# Patient Record
Sex: Male | Born: 1985 | Race: White | Hispanic: No | Marital: Single | Smoking: Never smoker
Health system: Southern US, Community
[De-identification: ages and names within clinical notes are randomized; demographics above are authoritative.]

## PROBLEM LIST (undated history)

## (undated) DIAGNOSIS — C801 Malignant (primary) neoplasm, unspecified: Secondary | ICD-10-CM

## (undated) HISTORY — PX: RECTAL SURGERY: SHX760

---

## 2007-11-05 ENCOUNTER — Emergency Department (HOSPITAL_COMMUNITY): Admission: EM | Admit: 2007-11-05 | Discharge: 2007-11-06 | Payer: Self-pay | Admitting: Emergency Medicine

## 2010-02-26 ENCOUNTER — Encounter: Payer: Self-pay | Admitting: Family Medicine

## 2011-08-10 ENCOUNTER — Ambulatory Visit (INDEPENDENT_AMBULATORY_CARE_PROVIDER_SITE_OTHER): Payer: 59 | Admitting: Physician Assistant

## 2011-08-10 ENCOUNTER — Encounter: Payer: Self-pay | Admitting: Physician Assistant

## 2011-08-10 VITALS — BP 121/71 | HR 95 | Ht 72.0 in | Wt 320.0 lb

## 2011-08-10 DIAGNOSIS — L658 Other specified nonscarring hair loss: Secondary | ICD-10-CM

## 2011-08-10 DIAGNOSIS — L649 Androgenic alopecia, unspecified: Secondary | ICD-10-CM

## 2011-08-10 MED ORDER — FINASTERIDE 1 MG PO TABS: 5.0000 mg | ORAL_TABLET | Freq: Every day | ORAL | Status: DC

## 2011-08-10 NOTE — Progress Notes (Signed)
  Subjective:    Patient ID: Dwayne Fowler, male    DOB: 1985/02/25, 26 y.o.   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 PMH  Reviewed other than morbid obesity and androgenic baldness neg for any other health concerns. He has not recently had fasting labs checked. He was on Proscar for about 3 months when he prescription ran out. He has noticed his hair loss is starting to occur again at the back/top of his head. He had no side effects of medication. He wants to go back on today.  Pt has struggled with his weight his entire life. He has lost 100lbs or more 2 times in his life but gained it all back. He knows he can lose the weight but doesn't know if he can keep it off. He is ready to lose weight today. He is currently exercising 3 times a week on elliptical. He has not started making diet changes.    Review of Systems     Objective:   Physical Exam  Constitutional: He appears well-developed and well-nourished.       Morbidly obese.  HENT:  Head: Normocephalic and atraumatic.       Thinning of hair at the top/back of scalp.  Cardiovascular: Normal rate, regular rhythm, normal heart sounds and intact distal pulses.   Pulmonary/Chest: Effort normal and breath sounds normal. He has no wheezes.  Skin: Skin is warm and dry.  Psychiatric: He has a normal mood and affect. His behavior is normal.          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   Morbid obesity- BMI 49.9. We had a long talk about weight loss. He knows exactly what to do and usually takes him 1 year to lose 100lbs. We discussed what things are effecting him the ability to keep the weight off. One was just getting bored. We talked about changing his exericse routine every 2-3 months. Or joining a group work out club like cross fit or getting a Systems analyst every 2-3  months for a month or so. For diet we talked about increasing protein, lifting weights, limiting sugary drinks and desserts to once or twice a week. We talked about a nutrionist but pt wanted to wait until he had worked on his diet first and then he would call back when and if he wanted a referral. We can talk more about this at CPE.    Needs CPE with fasting labs. Pt aware and will schedule.

## 2011-08-10 NOTE — Patient Instructions (Addendum)
Increase protein. Set goals for weight loss. Consider nurtritonist. Will refill medication. Schedule CPE and will get labs.

## 2011-08-13 DIAGNOSIS — L649 Androgenic alopecia, unspecified: Secondary | ICD-10-CM | POA: Insufficient documentation

## 2011-11-24 ENCOUNTER — Emergency Department (INDEPENDENT_AMBULATORY_CARE_PROVIDER_SITE_OTHER): Payer: 59

## 2011-11-24 ENCOUNTER — Emergency Department (INDEPENDENT_AMBULATORY_CARE_PROVIDER_SITE_OTHER)
Admission: EM | Admit: 2011-11-24 | Discharge: 2011-11-24 | Disposition: A | Discharge status: Home / Self Care | Payer: 59 | Source: Home / Self Care

## 2011-11-24 DIAGNOSIS — J069 Acute upper respiratory infection, unspecified: Secondary | ICD-10-CM

## 2011-11-24 DIAGNOSIS — J40 Bronchitis, not specified as acute or chronic: Secondary | ICD-10-CM

## 2011-11-24 DIAGNOSIS — R05 Cough: Secondary | ICD-10-CM

## 2011-11-24 DIAGNOSIS — R062 Wheezing: Secondary | ICD-10-CM

## 2011-11-24 DIAGNOSIS — R0989 Other specified symptoms and signs involving the circulatory and respiratory systems: Secondary | ICD-10-CM

## 2011-11-24 MED ORDER — METHYLPREDNISOLONE SODIUM SUCC 125 MG IJ SOLR
125.0000 mg | Freq: Once | INTRAMUSCULAR | Status: AC
  Administered 2011-11-24: 125 mg via INTRAMUSCULAR

## 2011-11-24 MED ORDER — ALBUTEROL SULFATE HFA 108 (90 BASE) MCG/ACT IN AERS: 2.0000 | INHALATION_SPRAY | RESPIRATORY_TRACT | Status: DC | PRN

## 2011-11-24 MED ORDER — AZITHROMYCIN 250 MG PO TABS: ORAL_TABLET | ORAL | Status: DC

## 2011-11-24 NOTE — ED Provider Notes (Signed)
History     CSN: [id number]  Arrival date & time 11/24/11  1122   First MD Initiated Contact with Patient 11/24/11 1131      Chief Complaint  Patient presents with  . Cough  . Nasal Congestion   HPI URI Symptoms Onset: 8 days ago Description: chest congestion, cough, mild URI sxs  Modifying factors:  Recurrent second hand smoke exposure  Symptoms Nasal discharge: mild Fever: no Sore throat: no Cough: yes  Wheezing: no Ear pain: no GI symptoms: no Sick contacts: unsure  Red Flags  Stiff neck: no Dyspnea: on exertion Rash: no Swallowing difficulty: no  Sinusitis Risk Factors Headache/face pain: no Double sickening: no tooth pain: no  Allergy Risk Factors Sneezing: no Itchy scratchy throat: no Seasonal symptoms: no  Flu Risk Factors Headache: no muscle aches: no severe fatigue: no   History reviewed. No pertinent past medical history.  History reviewed. No pertinent past surgical history.  No family history on file.  History  Substance Use Topics  . Smoking status: Never Smoker   . Smokeless tobacco: Never Used  . Alcohol Use: Yes      Review of Systems  All other systems reviewed and are negative.    Allergies  Codeine  Home Medications   Current Outpatient Rx  Name Route Sig Dispense Refill  . FINASTERIDE 1 MG PO TABS Oral Take 5 tablets (5 mg total) by mouth daily. Take 1/4 tab everyday. 30 tablet 3    BP 119/65  Pulse 78  Temp 98.2 F (36.8 C) (Oral)  Resp 16  Ht 6' (1.829 m)  Wt 334 lb 12 oz (151.842 kg)  BMI 45.40 kg/m2  SpO2 96%  Physical Exam  Constitutional:       Obese, NAD, speaking in full sentences    HENT:  Head: Normocephalic and atraumatic.  Eyes: Conjunctivae normal are normal. Pupils are equal, round, and reactive to light.  Neck: Normal range of motion. Neck supple.  Cardiovascular: Normal rate and regular rhythm.   Pulmonary/Chest: Effort normal.       Faint wheezes in apices    Abdominal: Soft.    Musculoskeletal: Normal range of motion.  Neurological: He is alert.  Skin: Skin is warm.    ED Course  Procedures (including critical care time)  Labs Reviewed - No data to display Dg Chest 2 View  11/24/2011  *RADIOLOGY REPORT*  Clinical Data: Cough and congestion.  Rule out infiltrate  CHEST - 2 VIEW  Comparison: None.  Findings: The heart size and mediastinal contours are within normal limits.  Both lungs are clear.  The visualized skeletal structures are unremarkable.  IMPRESSION: Negative examination.   Original Report Authenticated By: Rosealee Albee, M.D.      1. URI (upper respiratory infection)   2. Bronchitis   3. Wheezing on auscultation       MDM  CXR negative for any focal infiltrate.  Will place on zpak for atypical coverage given duration of sxs.  Given underlying wheezing on exam and chronic secondhand smoke exposure, there is some concern for underlying intermittent obstructive lung disease.  Solumedrol 125mg  IM x1 Albuterol for prn use at home.  Plan to follow up with PCP to discuss sxs. Would consider formal PFTs 4-6 weeks after resolution of current sxs.      The patient and/or caregiver has been counseled thoroughly with regard to treatment plan and/or medications prescribed including dosage, schedule, interactions, rationale for use, and possible side effects and they  verbalize understanding. Diagnoses and expected course of recovery discussed and will return if not improved as expected or if the condition worsens. Patient and/or caregiver verbalized understanding.             Doree Albee, MD 11/24/11 1215

## 2011-11-24 NOTE — ED Notes (Signed)
Patient c/o cough, congestion for 8 days. Denies sore throat. Has take OTC meds with no relief. States fever first 2 days.

## 2013-03-13 ENCOUNTER — Other Ambulatory Visit: Payer: Self-pay | Admitting: Physician Assistant

## 2013-03-31 ENCOUNTER — Other Ambulatory Visit: Payer: Self-pay | Admitting: Physician Assistant

## 2013-03-31 ENCOUNTER — Ambulatory Visit (INDEPENDENT_AMBULATORY_CARE_PROVIDER_SITE_OTHER): Payer: 59 | Admitting: Physician Assistant

## 2013-03-31 ENCOUNTER — Encounter: Payer: Self-pay | Admitting: Physician Assistant

## 2013-03-31 VITALS — BP 129/66 | HR 105 | Temp 99.7°F | Wt 316.0 lb

## 2013-03-31 DIAGNOSIS — Z131 Encounter for screening for diabetes mellitus: Secondary | ICD-10-CM

## 2013-03-31 DIAGNOSIS — J069 Acute upper respiratory infection, unspecified: Secondary | ICD-10-CM

## 2013-03-31 DIAGNOSIS — R509 Fever, unspecified: Secondary | ICD-10-CM

## 2013-03-31 DIAGNOSIS — R52 Pain, unspecified: Secondary | ICD-10-CM

## 2013-03-31 DIAGNOSIS — J029 Acute pharyngitis, unspecified: Secondary | ICD-10-CM

## 2013-03-31 DIAGNOSIS — Z13220 Encounter for screening for lipoid disorders: Secondary | ICD-10-CM

## 2013-03-31 LAB — COMPLETE METABOLIC PANEL WITH GFR
ALT: 37 U/L (ref 0–53)
AST: 21 U/L (ref 0–37)
Albumin: 4.5 g/dL (ref 3.5–5.2)
Alkaline Phosphatase: 85 U/L (ref 39–117)
BUN: 11 mg/dL (ref 6–23)
CALCIUM: 9 mg/dL (ref 8.4–10.5)
CHLORIDE: 99 meq/L (ref 96–112)
CO2: 26 meq/L (ref 19–32)
Creat: 0.97 mg/dL (ref 0.50–1.35)
GFR, Est Non African American: >89 mL/min
Glucose, Bld: 101 mg/dL — ABNORMAL HIGH (ref 70–99)
Potassium: 4.1 mEq/L (ref 3.5–5.3)
SODIUM: 135 meq/L (ref 135–145)
TOTAL PROTEIN: 7.5 g/dL (ref 6.0–8.3)
Total Bilirubin: 0.4 mg/dL (ref 0.2–1.2)

## 2013-03-31 LAB — LIPID PANEL
Cholesterol: 202 mg/dL — ABNORMAL HIGH (ref 0–200)
HDL: 41 mg/dL (ref >39)
LDL CALC: 145 mg/dL — AB (ref 0–99)
Total CHOL/HDL Ratio: 4.9 Ratio
Triglycerides: 80 mg/dL (ref <150)
VLDL: 16 mg/dL (ref 0–40)

## 2013-03-31 LAB — CBC
HCT: 47.8 % (ref 39.0–52.0)
HEMOGLOBIN: 16.6 g/dL (ref 13.0–17.0)
MCH: 30.7 pg (ref 26.0–34.0)
MCHC: 34.7 g/dL (ref 30.0–36.0)
MCV: 88.5 fL (ref 78.0–100.0)
Platelets: 219 10*3/uL (ref 150–400)
RBC: 5.4 MIL/uL (ref 4.22–5.81)
RDW: 14.2 % (ref 11.5–15.5)
WBC: 5.5 10*3/uL (ref 4.0–10.5)

## 2013-03-31 LAB — TSH: TSH: 0.547 u[IU]/mL (ref 0.350–4.500)

## 2013-03-31 LAB — POCT INFLUENZA A/B
INFLUENZA B, POC: NEGATIVE
Influenza A, POC: NEGATIVE

## 2013-03-31 MED ORDER — PHENTERMINE HCL 37.5 MG PO TABS: 37.5000 mg | ORAL_TABLET | Freq: Every day | ORAL | Status: DC

## 2013-03-31 MED ORDER — AMOXICILLIN 500 MG PO CAPS: 500.0000 mg | ORAL_CAPSULE | Freq: Two times a day (BID) | ORAL | Status: DC

## 2013-03-31 NOTE — Patient Instructions (Addendum)
mucinex DM twice a day.  Will refer to nutritionist. Will start with phentermine.   Upper Respiratory Infection, Adult An upper respiratory infection (URI) is also known as the common cold. It is often caused by a type of germ (virus). Colds are easily spread (contagious). You can pass it to others by kissing, coughing, sneezing, or drinking out of the same glass. Usually, you get better in 1 or 2 weeks.  HOME CARE   Only take medicine as told by your doctor.  Use a warm mist humidifier or breathe in steam from a hot shower.  Drink enough water and fluids to keep your pee (urine) clear or pale yellow.  Get plenty of rest.  Return to work when your temperature is back to normal or as told by your doctor. You may use a face mask and wash your hands to stop your cold from spreading. GET HELP RIGHT AWAY IF:   After the first few days, you feel you are getting worse.  You have questions about your medicine.  You have chills, shortness of breath, or brown or red spit (mucus).  You have yellow or brown snot (nasal discharge) or pain in the face, especially when you bend forward.  You have a fever, puffy (swollen) neck, pain when you swallow, or white spots in the back of your throat.  You have a bad headache, ear pain, sinus pain, or chest pain.  You have a high-pitched whistling sound when you breathe in and out (wheezing).  You have a lasting cough or cough up blood.  You have sore muscles or a stiff neck. MAKE SURE YOU:   Understand these instructions.  Will watch your condition.  Will get help right away if you are not doing well or get worse. Document Released: 07/11/2007 Document Revised: 04/16/2011 Document Reviewed: 05/29/2010 [facility] Patient Information 2014 [place], [place].

## 2013-03-31 NOTE — Progress Notes (Signed)
Subjective:    Patient ID: Dwayne Fowler, male    DOB: Oct 06, 1985, 28 y.o.   MRN: [id number]  HPI Patient is a 28 year old male who presents to the clinic to discuss recent illness and weight.  Patient started feeling bad at 2:00 yesterday afternoon. He started having generalized body aches, sore throat, headache and a productive deep cough. His temperature has been running around 100 or 101. Today temperature is 99.7 with palpitations. She's been taking Tylenol and has helped some with fever. He feels a little nauseated but denies any vomiting or diarrhea. His wife was diagnosed with a sinus infection on Saturday. He denies any wheezing or shortness of breath.  Patient also is very concerned about his weight. He has lost over 100 pounds times in his life. 3 months ago he decided he wanted to lose weight again. He started doing the same thing she had previously done before and he's not getting the same result. He is down maybe 5 pounds from when he started 3 months ago. He has decreased his calories down to 1800 a day along with 4 days of cardio for at least an hour. Is very frustrated and is seeking help. He is also concerned because he has a family history of diabetes, high cholesterol. Months to make sure is not telling with any other metabolic diseases. His stay HEENT to 1800-calorie diet that he minutes to not always making great food choices.    Review of Systems     Objective:   Physical Exam  Constitutional: He is oriented to person, place, and time. He appears well-developed and well-nourished.  Obese  HENT:  Head: Normocephalic and atraumatic.  Right Ear: External ear normal.  Left Ear: External ear normal.  Nose: Nose normal.  Mouth/Throat: Oropharynx is clear and moist. No oropharyngeal exudate.  TMs clear bilaterally. Some mild erythema present in both the ears.  Oropharynx erythematous with post nasal drip present.    Eyes: Conjunctivae are normal.  Neck: Normal  range of motion. Neck supple.  Cardiovascular: Normal rate, regular rhythm and normal heart sounds.   Pulmonary/Chest: Effort normal and breath sounds normal. He has no wheezes.  Lymphadenopathy:    He has no cervical adenopathy.  Neurological: He is alert and oriented to person, place, and time.  Psychiatric: He has a normal mood and affect. His behavior is normal.          Assessment & Plan:  URI/flu like symptoms- INfluenza negative. Discussed likely viral and OTC treatment discussed. Encouraged patient to try Mucinex and potentially over-the-counter nasal spray. If not improving over the next couple days for symptomatic treatment patient can try Amoxil for 10 days.  Obesity, morbid- Will refer to a nutritionist. Discussed weight loss therapy options. We decided to start with phentermine. Patient was aware of side effects of palpitations, increased blood pressure, increasing heart rate, insomnia. He has any of these is to stop medication call office. Patient was advised not to start phentermine until after going better. Patient made aware of the phentermine is only as good as diet and exercise. Continue working out and counting calories. Consider adding more protein and decreasing Carb and sugar load. Patient was made aware of the other long-term weight loss options such as Belviq, contrave, and qysmia. Concern for family history and obesity a cholesterol and other lites might be abnormal. Will check lipids, screening for diabetes and check a thyroid level. Followup in one month to see me for weight loss, blood pressure checked  accountability.  Spent 30 minutes with patient greater than 50% of visit spent counseling patient regarding weight loss.Marland Kitchen

## 2013-04-01 LAB — TESTOSTERONE, FREE, TOTAL, SHBG
SEX HORMONE BINDING: 31 nmol/L (ref 13–71)
TESTOSTERONE-% FREE: 1.9 % (ref 1.6–2.9)
TESTOSTERONE: 106 ng/dL — AB (ref 300–890)
Testosterone, Free: 20.2 pg/mL — ABNORMAL LOW (ref 47.0–244.0)

## 2013-04-01 LAB — HEMOGLOBIN A1C
HEMOGLOBIN A1C: 5.7 % — AB (ref <5.7)
Mean Plasma Glucose: 117 mg/dL — ABNORMAL HIGH (ref <117)

## 2013-04-03 ENCOUNTER — Telehealth: Payer: Self-pay | Admitting: *Deleted

## 2013-04-28 ENCOUNTER — Ambulatory Visit (INDEPENDENT_AMBULATORY_CARE_PROVIDER_SITE_OTHER): Payer: 59 | Admitting: Physician Assistant

## 2013-04-28 ENCOUNTER — Encounter: Payer: Self-pay | Admitting: Physician Assistant

## 2013-04-28 VITALS — BP 116/73 | HR 86 | Ht 72.0 in | Wt 295.0 lb

## 2013-04-28 DIAGNOSIS — E291 Testicular hypofunction: Secondary | ICD-10-CM

## 2013-04-28 DIAGNOSIS — R7989 Other specified abnormal findings of blood chemistry: Secondary | ICD-10-CM

## 2013-04-28 DIAGNOSIS — Z6841 Body Mass Index (BMI) 40.0 and over, adult: Secondary | ICD-10-CM

## 2013-04-28 DIAGNOSIS — R635 Abnormal weight gain: Secondary | ICD-10-CM

## 2013-04-28 MED ORDER — PHENTERMINE HCL 37.5 MG PO TABS: 37.5000 mg | ORAL_TABLET | Freq: Every day | ORAL | Status: DC

## 2013-04-28 NOTE — Patient Instructions (Signed)
Zyrtec at night.

## 2013-04-28 NOTE — Progress Notes (Signed)
   Subjective:    Patient ID: Dwayne Fowler, male    DOB: 05/18/1985, 28 y.o.   MRN: [id number]  HPI Patient is a 28 year old obese male who presents to the clinic to followup on phentermine and medication management. He has tolerated phentermine parallel. The first couple of days he did have a headache but that quickly resolved. He has spell much less hungry and has decreased his calorie significantly. He has not been counting his calories but has made many adjustments with decreasing sugar, carbs, quantity. He is exercising more. This month he has increased exercise to 3-4 days. He does feel like his heart rate does increase faster than it previously did being on the phentermine but he reports to keep it in the controlled range while exercising. He denies any other side effects such as insomnia. He feels focused on the medication would like to continue. Patient lost 21 pounds this month.  He is much less fatigued however still feels tired. His testosterone was checked at last visit and was 106. We will recheck today.   Review of Systems     Objective:   Physical Exam  Constitutional: He is oriented to person, place, and time. He appears well-developed and well-nourished.  HENT:  Head: Normocephalic and atraumatic.  Cardiovascular: Normal rate, regular rhythm and normal heart sounds.   Pulmonary/Chest: Effort normal and breath sounds normal. He has no wheezes.  Neurological: He is alert and oriented to person, place, and time.  Psychiatric: He has a normal mood and affect. His behavior is normal.          Assessment & Plan:  Obesity/40 BMI/abnormal weight gain-refilled phentermine today. Encouraged patient to continue with diet and exercise changes. Congratulated patient on such good results. Will followup in one month with blood pressure, heart rate, weight results. Blood pressure and pulse look great today.  Low testosterone level-we'll recheck today to make sure low-level  confirm. Discuss with patient testosterone replacement options with chills. Patient made aware that if he would like to family plan he does not need to use testosterone replacement at this time. Since his levels were so low if he is not planning to have children in the near future I would suggest going on some type of replacement for symptom benefit. If patient is to go ahead will call in testosterone therapy to start in followup for month later. Patient was made aware of the risk of testosterone and side effects. Will also check a PSA level before starting therapy.  We discussed labs and full that were drawn at last visit.  Spent 30 minutes with patient greater than 50% of visit spent counseling patient regarding weight management as well as testosterone therapy.

## 2013-04-29 LAB — TESTOSTERONE, FREE, TOTAL, SHBG
Sex Hormone Binding: 31 nmol/L (ref 13–71)
TESTOSTERONE FREE: 92.5 pg/mL (ref 47.0–244.0)
Testosterone-% Free: 2.1 % (ref 1.6–2.9)
Testosterone: 434 ng/dL (ref 300–890)

## 2013-04-29 LAB — PSA: PSA: 0.28 ng/mL (ref <=4.00)

## 2013-05-06 ENCOUNTER — Ambulatory Visit: Payer: 59 | Admitting: Physician Assistant

## 2013-05-26 ENCOUNTER — Ambulatory Visit (INDEPENDENT_AMBULATORY_CARE_PROVIDER_SITE_OTHER): Payer: 59 | Admitting: Physician Assistant

## 2013-05-26 ENCOUNTER — Encounter: Payer: Self-pay | Admitting: *Deleted

## 2013-05-26 VITALS — BP 103/76 | HR 101 | Wt 289.0 lb

## 2013-05-26 DIAGNOSIS — R635 Abnormal weight gain: Secondary | ICD-10-CM

## 2013-05-26 MED ORDER — PHENTERMINE HCL 37.5 MG PO TABS: 37.5000 mg | ORAL_TABLET | Freq: Every day | ORAL | Status: DC

## 2013-05-26 NOTE — Progress Notes (Signed)
   Subjective:    Patient ID: Dwayne Fowler, male    DOB: 1985/07/29, 28 y.o.   MRN: [id number] Pt here for bp/weight check.  He has lost 6lbs since last visit. No complaints of any side effects. Beatris Ship, CMA HPI    Review of Systems     Objective:   Physical Exam        Assessment & Plan:  Pt is doing well and continues to lose weight. BP looks great. HR borderline. Will continue to monitor. Follow up in one month. Continue diet and exercise. Refilled phentermine. Jade breeback PA-C

## 2013-08-03 ENCOUNTER — Other Ambulatory Visit: Payer: Self-pay | Admitting: Physician Assistant

## 2013-10-19 ENCOUNTER — Encounter: Payer: Self-pay | Admitting: Physician Assistant

## 2013-10-19 ENCOUNTER — Ambulatory Visit (INDEPENDENT_AMBULATORY_CARE_PROVIDER_SITE_OTHER): Payer: 59 | Admitting: Physician Assistant

## 2013-10-19 VITALS — BP 126/69 | HR 73 | Ht 72.0 in | Wt 280.0 lb

## 2013-10-19 DIAGNOSIS — E669 Obesity, unspecified: Secondary | ICD-10-CM

## 2013-10-19 DIAGNOSIS — R635 Abnormal weight gain: Secondary | ICD-10-CM

## 2013-10-19 MED ORDER — PHENTERMINE HCL 37.5 MG PO TABS: 37.5000 mg | ORAL_TABLET | Freq: Every day | ORAL | Status: DC

## 2013-10-20 DIAGNOSIS — R635 Abnormal weight gain: Secondary | ICD-10-CM | POA: Insufficient documentation

## 2013-10-20 NOTE — Progress Notes (Signed)
   Subjective:    Patient ID: Dwayne Fowler, male    DOB: Nov 22, 1985, 28 y.o.   MRN: [id number]  HPI Pt presents to the clinic to start back on phentermine. He was on it for 2 months and lost weight each month. He did not come back into the office for nurse visit and was denied. He has worked out and started counting calories 1500-1800. Lost 9 more pounds since last visit. Denies any side effects except some sweating.    Review of Systems  All other systems reviewed and are negative.      Objective:   Physical Exam  Constitutional: He is oriented to person, place, and time. He appears well-developed and well-nourished.  Obese.   HENT:  Head: Normocephalic and atraumatic.  Cardiovascular: Normal rate, regular rhythm and normal heart sounds.   Neurological: He is alert and oriented to person, place, and time.  Psychiatric: He has a normal mood and affect. His behavior is normal.          Assessment & Plan:  Obesity/abnormal weight gain- BMI 37.97. Refilled phentermine. He is doing great. Continue exercising and keeping healthy calorie count. Follow up nurse visit in 2 months.

## 2013-12-25 ENCOUNTER — Ambulatory Visit (INDEPENDENT_AMBULATORY_CARE_PROVIDER_SITE_OTHER): Payer: 59 | Admitting: Physician Assistant

## 2013-12-25 VITALS — BP 106/70 | HR 81 | Wt 257.0 lb

## 2013-12-25 DIAGNOSIS — E669 Obesity, unspecified: Secondary | ICD-10-CM

## 2013-12-25 MED ORDER — PHENTERMINE HCL 37.5 MG PO TABS: 37.5000 mg | ORAL_TABLET | Freq: Every day | ORAL | Status: DC

## 2013-12-25 NOTE — Progress Notes (Signed)
   Subjective:    Patient ID: Dwayne Fowler, male    DOB: 01-28-1986, 28 y.o.   MRN: [id number]  HPI  Januel is here for a blood pressure and weight check. Denies insomnia, shortness of breath, chest pain or palpitations.    Review of Systems     Objective:   Physical Exam        Assessment & Plan:  Zayven has lost weight of 23lbs.  A refill will be sent to [facility]. Patient advised to schedule a follow up nurse visit for blood pressure and weight check. Jade breeback PA-C

## 2014-01-22 ENCOUNTER — Ambulatory Visit (INDEPENDENT_AMBULATORY_CARE_PROVIDER_SITE_OTHER): Payer: 59 | Admitting: Physician Assistant

## 2014-01-22 VITALS — BP 107/70 | HR 88 | Wt 249.0 lb

## 2014-01-22 DIAGNOSIS — R635 Abnormal weight gain: Secondary | ICD-10-CM

## 2014-01-22 MED ORDER — PHENTERMINE HCL 37.5 MG PO TABS: 37.5000 mg | ORAL_TABLET | Freq: Every day | ORAL | Status: DC

## 2014-01-22 NOTE — Progress Notes (Signed)
   Subjective:    Patient ID: Dwayne Fowler, male    DOB: 12/18/1985, 28 y.o.   MRN: [id number]  HPI  Dwayne Fowler is here for blood pressure and weight check. Denies insomnia or palpitations.    Review of Systems     Objective:   Physical Exam        Assessment & Plan:  Obesity - Patient has lost weight and blood pressure is normal. A refill on phentermine will be faxed to [facility].

## 2014-02-19 ENCOUNTER — Ambulatory Visit: Payer: 59 | Admitting: Physician Assistant

## 2014-03-26 ENCOUNTER — Ambulatory Visit (INDEPENDENT_AMBULATORY_CARE_PROVIDER_SITE_OTHER): Payer: 59 | Admitting: Physician Assistant

## 2014-03-26 ENCOUNTER — Encounter: Payer: Self-pay | Admitting: Physician Assistant

## 2014-03-26 VITALS — BP 105/66 | HR 73 | Ht 72.0 in | Wt 246.0 lb

## 2014-03-26 DIAGNOSIS — E785 Hyperlipidemia, unspecified: Secondary | ICD-10-CM

## 2014-03-26 DIAGNOSIS — E669 Obesity, unspecified: Secondary | ICD-10-CM

## 2014-03-26 DIAGNOSIS — R634 Abnormal weight loss: Secondary | ICD-10-CM

## 2014-03-26 DIAGNOSIS — Z131 Encounter for screening for diabetes mellitus: Secondary | ICD-10-CM

## 2014-03-26 LAB — COMPLETE METABOLIC PANEL WITH GFR
ALBUMIN: 4.5 g/dL (ref 3.5–5.2)
ALT: 39 U/L (ref 0–53)
AST: 31 U/L (ref 0–37)
Alkaline Phosphatase: 81 U/L (ref 39–117)
BUN: 24 mg/dL — AB (ref 6–23)
CALCIUM: 9.5 mg/dL (ref 8.4–10.5)
CO2: 27 mEq/L (ref 19–32)
CREATININE: 0.95 mg/dL (ref 0.50–1.35)
Chloride: 102 mEq/L (ref 96–112)
GLUCOSE: 77 mg/dL (ref 70–99)
Potassium: 4.9 mEq/L (ref 3.5–5.3)
Sodium: 136 mEq/L (ref 135–145)
Total Bilirubin: 0.6 mg/dL (ref 0.2–1.2)
Total Protein: 7.3 g/dL (ref 6.0–8.3)

## 2014-03-26 LAB — LIPID PANEL
CHOL/HDL RATIO: 5.3 ratio
Cholesterol: 210 mg/dL — ABNORMAL HIGH (ref 0–200)
HDL: 40 mg/dL (ref >39)
LDL Cholesterol: 155 mg/dL — ABNORMAL HIGH (ref 0–99)
Triglycerides: 77 mg/dL (ref <150)
VLDL: 15 mg/dL (ref 0–40)

## 2014-03-26 MED ORDER — PHENTERMINE HCL 37.5 MG PO TABS
37.5000 mg | ORAL_TABLET | Freq: Every day | ORAL | Status: DC
Start: 2014-03-26 — End: 2016-05-09

## 2014-03-26 NOTE — Progress Notes (Signed)
   Subjective:    Patient ID: Dwayne Fowler, male    DOB: 1985-08-22, 29 y.o.   MRN: [id number]  HPI Pt presents to the clinic to follow up on weight loss. Admits to not working out over last 2 months. Down another 3lbs. 1800 calorie diet. Doing ok with this. Goal 200lbs. No side effects. Doing great.    Review of Systems  All other systems reviewed and are negative.      Objective:   Physical Exam  Constitutional: He is oriented to person, place, and time. He appears well-developed and well-nourished.  HENT:  Head: Normocephalic and atraumatic.  Cardiovascular: Normal rate, regular rhythm and normal heart sounds.   Pulmonary/Chest: Effort normal and breath sounds normal. He has no wheezes.  Neurological: He is alert and oriented to person, place, and time.  Skin: Skin is dry.  Psychiatric: He has a normal mood and affect. His behavior is normal.          Assessment & Plan:  Abnormal weight gain/obesity- Goal is 200lbs. 46 pounds to go. Increase exercise. Given pt one month of 30 tabs to break in half and take daily to every other day. Vitals look good. Been on for 5 months will start to taper off over next couple of months. Follow up in 2-3 months.   Hyperlipidemia- been over a year. Will recheck. Last LDL 145.

## 2014-06-11 ENCOUNTER — Other Ambulatory Visit: Payer: Self-pay | Admitting: Physician Assistant

## 2014-10-25 ENCOUNTER — Other Ambulatory Visit: Payer: Self-pay | Admitting: Physician Assistant

## 2015-02-07 ENCOUNTER — Other Ambulatory Visit: Payer: Self-pay | Admitting: Physician Assistant

## 2015-09-15 ENCOUNTER — Other Ambulatory Visit: Payer: Self-pay | Admitting: Physician Assistant

## 2016-05-09 ENCOUNTER — Encounter: Payer: Self-pay | Admitting: Physician Assistant

## 2016-05-09 ENCOUNTER — Ambulatory Visit (INDEPENDENT_AMBULATORY_CARE_PROVIDER_SITE_OTHER): Payer: 59 | Admitting: Physician Assistant

## 2016-05-09 VITALS — BP 108/69 | HR 63 | Ht 72.0 in | Wt 259.0 lb

## 2016-05-09 DIAGNOSIS — E669 Obesity, unspecified: Secondary | ICD-10-CM | POA: Insufficient documentation

## 2016-05-09 DIAGNOSIS — R41840 Attention and concentration deficit: Secondary | ICD-10-CM

## 2016-05-09 DIAGNOSIS — K921 Melena: Secondary | ICD-10-CM | POA: Diagnosis not present

## 2016-05-09 DIAGNOSIS — Z0000 Encounter for general adult medical examination without abnormal findings: Secondary | ICD-10-CM

## 2016-05-09 DIAGNOSIS — Z131 Encounter for screening for diabetes mellitus: Secondary | ICD-10-CM

## 2016-05-09 DIAGNOSIS — F418 Other specified anxiety disorders: Secondary | ICD-10-CM | POA: Diagnosis not present

## 2016-05-09 DIAGNOSIS — Z13220 Encounter for screening for lipoid disorders: Secondary | ICD-10-CM

## 2016-05-09 DIAGNOSIS — F419 Anxiety disorder, unspecified: Secondary | ICD-10-CM

## 2016-05-09 DIAGNOSIS — F329 Major depressive disorder, single episode, unspecified: Secondary | ICD-10-CM

## 2016-05-09 MED ORDER — BUPROPION HCL ER (XL) 150 MG PO TB24: 150.0000 mg | ORAL_TABLET | Freq: Every day | ORAL | 1 refills | Status: DC

## 2016-05-09 NOTE — Progress Notes (Signed)
Subjective:    Patient ID: Dwayne Fowler, male    DOB: July 30, 1985, 31 y.o.   MRN: [id number]  HPI  Pt is a 31 yo male who presents to the clinic for CPE.   Pt continues to want to lose more weight. 2 years ago he was 334 today is 259 but would like to get to 220. He is working out regularly and lifting weights for muscle mass.   Pt is concerned about his focus and attention. He works from home. He has trouble getting things done. 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 Dropped out of high school. Never been ADD tested. He does admit to some anxiety and depression. He feels like he is contantly worried about when is he going to lose his job etc.   He reports blood in stool for 4 months. Blood is bright red and line stool. He is having 2 bowel movements a day and in every stool. No pain or itching. BM's are soft. Does not appear to be constipated. No family hx of colon cancer. No abdominal pain.   .. Active Ambulatory Problems    Diagnosis Date Noted  . 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 Obesity, Class III, BMI 40-49.9 (morbid obesity) ([place]) 08/13/2011  . Abnormal weight gain 10/20/2013  . Obesity, unspecified 10/20/2013  . Hyperlipidemia 03/26/2014  . Obesity 03/26/2014  . Obesity (BMI 30-39.9) 05/09/2016   Resolved Ambulatory Problems    Diagnosis Date Noted  . No Resolved Ambulatory Problems   No Additional Past Medical History     Review of Systems  All other systems reviewed and are negative.      Objective:   Physical Exam  Constitutional: He is oriented to person, place, and time. He appears well-developed and well-nourished.  HENT:  Head: Normocephalic and atraumatic.  Right Ear: External ear normal.  Left Ear: External ear normal.  Nose: Nose normal.  Mouth/Throat: Oropharynx is clear and moist. No oropharyngeal exudate.  Eyes: Conjunctivae and EOM are normal. Pupils are equal, round, and reactive to light.   Neck: Normal range of motion. Neck supple. No thyromegaly present.  Cardiovascular: Normal rate, regular rhythm and normal heart sounds.   Pulmonary/Chest: Effort normal and breath sounds normal.  Abdominal: Soft. Bowel sounds are normal. He exhibits no distension and no mass. There is no tenderness. There is no rebound and no guarding.  Genitourinary:  Genitourinary Comments: Pt declined rectal exam. Sending to GI.   Musculoskeletal: Normal range of motion.  Lymphadenopathy:    He has no cervical adenopathy.  Neurological: He is alert and oriented to person, place, and time.  Skin: Skin is dry.  Psychiatric: He has a normal mood and affect. His behavior is normal.          Assessment & Plan:   Marland KitchenMarland KitchenRaykwon was seen today for blood in stools and inattention.  Diagnoses and all orders for this visit:  Routine physical examination -     Lipid panel -     COMPLETE METABOLIC PANEL WITH GFR -     CBC with Differential/Platelet  Screening for lipid disorders -     Lipid panel  Screening for diabetes mellitus -     COMPLETE METABOLIC PANEL WITH GFR  Blood in stool -     Ambulatory referral to Gastroenterology  Inattention  Anxiety and depression  Obesity (BMI 30-39.9)  Other orders -     buPROPion [facility]  XL) 150 MG 24 hr tablet; Take 1 tablet (150 mg total) by mouth daily.    .. Depression screen [facility] 2/9 05/09/2016  Decreased Interest 2  Down, Depressed, Hopeless 2  PHQ - 2 Score 4  Altered sleeping 1  Tired, decreased energy 2  Change in appetite 3  Feeling bad or failure about yourself  0  Trouble concentrating 3  Moving slowly or fidgety/restless 0  Suicidal thoughts 0  PHQ-9 Score 13   .Marland Kitchen GAD 7 : Generalized Anxiety Score 05/09/2016  Nervous, Anxious, on Edge 1  Control/stop worrying 3  Worry too much - different things 3  Trouble relaxing 2  Restless 0  Easily annoyed or irritable 2  Afraid - awful might happen 3  Total GAD 7 Score 14   ADHD  screening Part A 5/6. Part B 6/12  I feel like some anxiety could be worsening focus issues. Would like to start with wellbutrin. Discussed side effects. Follow up in 4-6 weeks.   Weight-wellbutrin could help. Keep up the good work. No changes made to medication today. Will consider medication if cannot do on own.   Blood is stool- referral to GI made. Ongoing for a few months every stool. It seems like patient is not constipation and not having any hemorrhoid symptoms.

## 2016-05-10 DIAGNOSIS — F32A Depression, unspecified: Secondary | ICD-10-CM | POA: Insufficient documentation

## 2016-05-10 DIAGNOSIS — F419 Anxiety disorder, unspecified: Secondary | ICD-10-CM

## 2016-05-10 DIAGNOSIS — F329 Major depressive disorder, single episode, unspecified: Secondary | ICD-10-CM | POA: Insufficient documentation

## 2016-05-10 DIAGNOSIS — K921 Melena: Secondary | ICD-10-CM | POA: Insufficient documentation

## 2016-05-10 DIAGNOSIS — R41840 Attention and concentration deficit: Secondary | ICD-10-CM | POA: Insufficient documentation

## 2016-05-10 LAB — CBC WITH DIFFERENTIAL/PLATELET
BASOS ABS: 62 {cells}/uL (ref 0–200)
Basophils Relative: 1 %
EOS ABS: 248 {cells}/uL (ref 15–500)
EOS PCT: 4 %
HCT: 46.2 % (ref 38.5–50.0)
Hemoglobin: 15.6 g/dL (ref 13.2–17.1)
LYMPHS ABS: 2666 {cells}/uL (ref 850–3900)
Lymphocytes Relative: 43 %
MCH: 30.9 pg (ref 27.0–33.0)
MCHC: 33.8 g/dL (ref 32.0–36.0)
MCV: 91.5 fL (ref 80.0–100.0)
MPV: 10.2 fL (ref 7.5–12.5)
Monocytes Absolute: 372 cells/uL (ref 200–950)
Monocytes Relative: 6 %
NEUTROS PCT: 46 %
Neutro Abs: 2852 cells/uL (ref 1500–7800)
PLATELETS: 266 10*3/uL (ref 140–400)
RBC: 5.05 MIL/uL (ref 4.20–5.80)
RDW: 13.6 % (ref 11.0–15.0)
WBC: 6.2 10*3/uL (ref 3.8–10.8)

## 2016-05-10 LAB — COMPLETE METABOLIC PANEL WITH GFR
ALBUMIN: 4.1 g/dL (ref 3.6–5.1)
ALK PHOS: 72 U/L (ref 40–115)
ALT: 27 U/L (ref 9–46)
AST: 28 U/L (ref 10–40)
BUN: 16 mg/dL (ref 7–25)
CO2: 26 mmol/L (ref 20–31)
Calcium: 9.2 mg/dL (ref 8.6–10.3)
Chloride: 103 mmol/L (ref 98–110)
Creat: 1.02 mg/dL (ref 0.60–1.35)
GFR, Est African American: >89 mL/min (ref >=60)
GFR, Est Non African American: >89 mL/min (ref >=60)
GLUCOSE: 80 mg/dL (ref 65–99)
POTASSIUM: 4.2 mmol/L (ref 3.5–5.3)
SODIUM: 138 mmol/L (ref 135–146)
TOTAL PROTEIN: 7 g/dL (ref 6.1–8.1)
Total Bilirubin: 0.4 mg/dL (ref 0.2–1.2)

## 2016-05-10 LAB — LIPID PANEL
Cholesterol: 202 mg/dL — ABNORMAL HIGH (ref <200)
HDL: 44 mg/dL (ref >40)
LDL Cholesterol: 141 mg/dL — ABNORMAL HIGH (ref <100)
Total CHOL/HDL Ratio: 4.6 Ratio (ref <5.0)
Triglycerides: 86 mg/dL (ref <150)
VLDL: 17 mg/dL (ref <30)

## 2016-07-09 ENCOUNTER — Ambulatory Visit (INDEPENDENT_AMBULATORY_CARE_PROVIDER_SITE_OTHER): Payer: 59 | Admitting: Physician Assistant

## 2016-07-09 ENCOUNTER — Encounter: Payer: Self-pay | Admitting: Physician Assistant

## 2016-07-09 VITALS — BP 112/65 | HR 80 | Ht 72.0 in | Wt 248.0 lb

## 2016-07-09 DIAGNOSIS — L649 Androgenic alopecia, unspecified: Secondary | ICD-10-CM | POA: Diagnosis not present

## 2016-07-09 DIAGNOSIS — F329 Major depressive disorder, single episode, unspecified: Secondary | ICD-10-CM | POA: Diagnosis not present

## 2016-07-09 DIAGNOSIS — R41840 Attention and concentration deficit: Secondary | ICD-10-CM | POA: Diagnosis not present

## 2016-07-09 DIAGNOSIS — F419 Anxiety disorder, unspecified: Secondary | ICD-10-CM

## 2016-07-09 DIAGNOSIS — F32A Depression, unspecified: Secondary | ICD-10-CM

## 2016-07-09 DIAGNOSIS — E669 Obesity, unspecified: Secondary | ICD-10-CM | POA: Diagnosis not present

## 2016-07-09 DIAGNOSIS — E7800 Pure hypercholesterolemia, unspecified: Secondary | ICD-10-CM

## 2016-07-09 MED ORDER — FINASTERIDE 5 MG PO TABS: ORAL_TABLET | ORAL | 3 refills | Status: AC

## 2016-07-09 MED ORDER — BUPROPION HCL ER (XL) 300 MG PO TB24: 300.0000 mg | ORAL_TABLET | Freq: Every day | ORAL | 3 refills | Status: DC

## 2016-07-09 NOTE — Progress Notes (Signed)
   Subjective:    Patient ID: Dwayne Fowler, male    DOB: 12-27-1985, 31 y.o.   MRN: [id number]  HPI Pt is a 31 yo male who presents to the clinic for 2 month follow up on wellbutrin for inattention, anxiety, and depression. He continues to lose weight as he is down 11lbs from 2 months ago. He is sticking to a protein diet with low carbs and working out regularly. He does feel like his mood and inattention is better. He still has days where he cannot stay focused but most days are good. He does like his job more and feels like happier overall.   He continues to take finasteride for hair loss. He needs a refill. Denies any side effects.   .. Active Ambulatory Problems    Diagnosis Date Noted  . 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 Obesity, Class III, BMI 40-49.9 (morbid obesity) ([place]) 08/13/2011  . Abnormal weight gain 10/20/2013  . Hyperlipidemia 03/26/2014  . Obesity 03/26/2014  . Obesity (BMI 30-39.9) 05/09/2016  . Blood in stool 05/10/2016  . Inattention 05/10/2016  . Anxiety and depression 05/10/2016   Resolved Ambulatory Problems    Diagnosis Date Noted  . No Resolved Ambulatory Problems   No Additional Past Medical History        Review of Systems  All other systems reviewed and are negative.      Objective:   Physical Exam  Constitutional: He is oriented to person, place, and time. He appears well-developed and well-nourished.  HENT:  Head: Normocephalic and atraumatic.  Cardiovascular: Normal rate, regular rhythm and normal heart sounds.   Pulmonary/Chest: Effort normal and breath sounds normal.  Neurological: He is alert and oriented to person, place, and time.  Psychiatric: He has a normal mood and affect. His behavior is normal.          Assessment & Plan:  Marland KitchenMarland KitchenWister was seen today for obesity and inattention.  Diagnoses and all orders for this visit:  Inattention -     buPROPion (WELLBUTRIN XL) 300 MG 24 hr tablet; Take 1 tablet (300 mg total) by  mouth daily.  Obesity (BMI 30-39.9) -     buPROPion (WELLBUTRIN XL) 300 MG 24 hr tablet; Take 1 tablet (300 mg total) by mouth daily.  Anxiety and depression -     buPROPion (WELLBUTRIN XL) 300 MG 24 hr tablet; Take 1 tablet (300 mg total) by mouth daily.  Androgenic alopecia -     finasteride (PROSCAR) 5 MG tablet; TAKE 1/4 TABLET BY MOUTH EVERY DAY   Refilled wellbutrin for inattention/anxiety/depression/weight. Increased to 300mg . Follow up in 1 year or as needed. Goal weight 200 to 215. Keep up weight lifting and healthy diet.   Discussed labs from last visit. Encouraged red yeast rice as a supplement. Weight loss is likely going to help reduce numbers.   Spent 30 minutes with patient and greater than 50 percent of visit spent counseling patient regarding treatment plan.

## 2016-08-09 LAB — HM COLONOSCOPY

## 2016-08-16 ENCOUNTER — Encounter: Payer: Self-pay | Admitting: Physician Assistant

## 2017-01-14 ENCOUNTER — Ambulatory Visit: Payer: 59 | Admitting: Oncology

## 2017-01-15 ENCOUNTER — Telehealth: Payer: Self-pay

## 2017-01-15 NOTE — Telephone Encounter (Signed)
  Oncology Nurse Navigator Documentation      )                             Dr. Benay Spice requested that patient be rescheduled with Ned Card NP on 01/17/17 @ 2:15 PM. I called patient and verbalized that he will be at the appointment. Scheduling message sent.

## 2017-01-15 NOTE — Telephone Encounter (Signed)
Appointment changed to 3:15 PM on 12/18/16. Patient aware to arrive at 2:45 PM. Patient verbalized understanding.

## 2017-01-17 ENCOUNTER — Encounter: Payer: Self-pay | Admitting: Nurse Practitioner

## 2017-01-17 ENCOUNTER — Ambulatory Visit (HOSPITAL_BASED_OUTPATIENT_CLINIC_OR_DEPARTMENT_OTHER): Payer: 59 | Admitting: Nurse Practitioner

## 2017-01-17 VITALS — BP 103/59 | HR 91 | Temp 98.4°F | Resp 18 | Ht 72.0 in | Wt 276.4 lb

## 2017-01-17 DIAGNOSIS — K769 Liver disease, unspecified: Secondary | ICD-10-CM | POA: Diagnosis not present

## 2017-01-17 DIAGNOSIS — R599 Enlarged lymph nodes, unspecified: Secondary | ICD-10-CM

## 2017-01-17 DIAGNOSIS — C20 Malignant neoplasm of rectum: Secondary | ICD-10-CM | POA: Diagnosis not present

## 2017-01-17 DIAGNOSIS — R911 Solitary pulmonary nodule: Secondary | ICD-10-CM | POA: Diagnosis not present

## 2017-01-17 DIAGNOSIS — M545 Low back pain: Secondary | ICD-10-CM

## 2017-01-17 NOTE — Progress Notes (Addendum)
New Hematology/Oncology Consult   Referral MD:  [phone]      Reason for Referral: Rectal cancer  HPI: Dwayne Fowler is a 31 year old man currently completing adjuvant chemotherapy for rectal cancer.  He is receiving his oncology care through [facility].  To review, he had an episode of rectal bleeding in late 2017.  Around March 2018 the bleeding recurred and persisted.  He also noted decreased caliber of stool.  Colonoscopy 08/09/2016 showed a 4 cm mass at 14-18 cm from the anus.  Biopsies showed invasive high-grade adenocarcinoma.  CEA on 08/09/2016 was normal at 1.7. CT scans of the chest, abdomen and pelvis 08/10/2016 showed a rectal mass and perirectal and pelvic adenopathy; also noted was a small low-density right hepatic lobe lesion too small to accurately characterize.  MRI pelvis 08/17/2016 showed a mid rectal tumor, T3 N2.  The mass measured approximately 5-6 cm in length and involved the left posterior wall of the rectum.  Tumor extended through the muscularis externa.  There were numerous suspicious mesorectal and mid pelvic lymph nodes measuring up to 8 mm.  A right perirectal node measured 8 mm.  MRI of the abdomen was done on  08/27/2016 to follow-up the liver lesion noted on CT.  The lesion in question in the right hepatic lobe was favored to represent a simple cyst.  He completed neoadjuvant radiation/Xelodaon  10/15/2016.  Follow-up MRI of the pelvis 11/15/2016 showed interval decrease in the size of the high rectal tumor; small amount of abnormal tissue within the mesorectal fat decreased from prior; there was some abnormal enhancement extending to the left mesorectal fascia which could be tumor involvement; overall improvement in perirectal and presacral adenopathy; subcentimeter lymph node near the right common iliac bifurcation, new from prior; slightly more prominent soft tissue nodule along the anterior inferior aspect of the sigmoid colon.  On 12/24/2016 he underwent LAR with  diverting loop ileostomy.  Pathology showed a 3.8 cm poorly differentiated adenocarcinoma; focally positive circumferential margin; distal margin positive for carcinoma in the muscularis propria and subserosal adipose tissue; lymphovascular invasion present; 17 of 19 lymph nodes positive for metastatic carcinoma.  Follow-up CTs chest abdomen and pelvis 01/16/2017 showed a new 7 mm right middle lobe nodule; 2 subcentimeter hypodensities within the right hepatic lobe without correlates on the previous abdominal MRI; mild retroperitoneal and bilateral iliac chain lymphadenopathy; 15 x 12 mm node along the left side of the aortic bifurcation; 17 x 12 mm node to the left of the distal aorta; additional smaller periaortic nodes.  He began cycle 1 adjuvant CAPOX today.   Past medical history: 1. Rectal cancer as outlined above  Past surgical history: 1. LAR with diverting loop ileostomy 12/24/2016   Current Outpatient Medications:  .  capecitabine (XELODA) 500 MG tablet, Take by mouth., Disp: , Rfl:  .  diphenoxylate-atropine (LOMOTIL) 2.5-0.025 MG tablet, , Disp: , Rfl:  .  finasteride (PROSCAR) 5 MG tablet, TAKE 1/4 TABLET BY MOUTH EVERY DAY, Disp: 90 tablet, Rfl: 3 .  prochlorperazine (COMPAZINE) 5 MG tablet, Take 5 mg by mouth every 6 (six) hours as needed for nausea or vomiting., Disp: , Rfl:  .  Skin Protectants, Misc. (EUCERIN) cream, Apply topically twice daily and as needed for Dry Skin, hands, and feet., Disp: , Rfl:  .  traMADol (ULTRAM) 50 MG tablet, , Disp: , Rfl:  .  cholestyramine (QUESTRAN) 4 g packet, Take by mouth., Disp: , Rfl:  .  ondansetron (ZOFRAN) 8 MG tablet, , Disp: ,  Rfl: :  :   Allergies  Allergen Reactions  . Codeine Nausea And Vomiting  Patient denies an allergy to codeine.  FH: No family history of malignancy.  SOCIAL HISTORY: He lives in [place].  He is single.  No children.  He is employed as a Scientist, forensic.  No tobacco use.  Occasional  alcohol use.  Review of Systems: He notes a decreased energy level since surgery.  Describes his appetite as ok.  He intermittently has pain at the left mid back region.  Ileostomy is functioning normally.  He takes an antidiarrheal medication as needed.  He denies nausea.  He notes cold sensitivity and right arm pain since receiving Oxaliplatin earlier today.  He has some pain with urination which he relates to surgery.  Physical Exam:  Blood pressure (!) 103/59, pulse 91, temperature 98.4 F (36.9 C), temperature source Oral, resp. rate 18, height 6' (1.829 m), weight 276 lb 6.4 oz (125.4 kg), SpO2 96 %.  HEENT: Neck without mass. Lungs: Lungs clear bilaterally. Cardiac: Regular rate and rhythm. Abdomen: Abdomen soft and nontender.  No hepatomegaly.  Right abdomen ileostomy.  Vascular: Very trace lower leg edema bilaterally.  Calves soft and nontender. Lymph nodes: No palpable cervical, supraclavicular, axillary or inguinal lymph nodes. Neurologic: Alert and oriented. Skin: No rash.  LABS:  None.  RADIOLOGY:  Assessment and Plan:  1. Rectal cancer July 2018  Presented with rectal bleeding and change in stool caliber  Colonoscopy 08/09/2016 showed a 4 cm mass at 14-18 cm from the anus.  Biopsies showed invasive high-grade adenocarcinoma.  Normal mismatch repair protein expression.  CEA 08/09/2016 1.7  CT scans of the chest, abdomen and pelvis 08/10/2016-rectal mass and perirectal and pelvic adenopathy; also noted was a small low-density right hepatic lobe lesion too small to accurately characterize.    MRI pelvis 08/17/2016-mid rectal tumor, T3 N2.  Mass measured approximately 5-6 cm in length and involved the left posterior wall of the rectum.  Tumor extended through the muscularis externa.  There were numerous suspicious mesorectal and mid pelvic lymph nodes measuring up to 8 mm.  A right perirectal node measured 8 mm.    MRI of the abdomen 08/27/2016 to follow-up the liver lesion  noted on CT.  The lesion in question in the right hepatic lobe was favored to represent a simple cyst.    Completed neoadjuvant radiation/Xeloda on  10/15/2016.    11/15/2016 follow-up MRI of the pelvis-interval decrease in the size of the high rectal tumor; small amount of abnormal tissue within the mesorectal fat decreased from prior; there was some abnormal enhancement extending to the left mesorectal fascia which could be tumor involvement; overall improvement in perirectal and presacral adenopathy; subcentimeter lymph node near the right common iliac bifurcation, new from prior; slightly more prominent soft tissue nodule along the anterior inferior aspect of the sigmoid colon.    12/24/2016 status post LAR with diverting loop ileostomy.  Pathology showed a 3.8 cm poorly differentiated adenocarcinoma; focally positive circumferential margin; distal margin positive for carcinoma in the muscularis propria and subserosal adipose tissue; lymphovascular invasion present; 17 of 19 lymph nodes positive for metastatic carcinoma.    Follow-up CTs chest abdomen and pelvis 01/16/2017-new 7 mm right middle lobe nodule; 2 subcentimeter hypodensities within the right hepatic lobe without correlates on the previous abdominal MRI; mild retroperitoneal and bilateral iliac chain lymphadenopathy; 15 x 12 mm node along the left side of the aortic bifurcation; 17 x 12 mm  node to the left of the distal aorta; additional smaller periaortic nodes.    Cycle 1 adjuvant CAPOX 01/17/2017 2. Cold sensitivity and right arm pain secondary to Oxaliplatin  Disposition: Mr. Wolford is a 31 year old man with stage III rectal cancer currently completing adjuvant chemotherapy through oncology at [facility].  He had CT scans done yesterday which question the possibility of metastatic disease involving a lung nodule, 2 liver lesions and retroperitoneal adenopathy.  Recommendations: 1. Continue adjuvant CAPOX chemotherapy to  complete a 42-monthcourse of therapy 2. Consider placement of a Port-A-Cath 3. Follow-up CT scans at a 375-monthnterval 4. Consider MRI of the liver now 5. Foundation 1 testing  We will be happy to see Mr. SaMccollamn the future.  He will contact our office if he would like to schedule a follow-up visit.   Patient seen with Dr. ShBenay Spice  ThNed CardNP 01/17/2017, 4:51 PM   This was a shared visit with LiNed Card Mr. SaGaffeyas interviewed and examined.  We reviewed the operative note, pathology report, and CT report from earlier this week.  We discussed the prognosis and treatment options with Mr. SaDelaguila I agree with the treatment course to date and the current plan to complete "adjuvant "Capox. He understands the likelihood of having metastatic disease based on the multiple involved lymph nodes, surgical margin, and a recent CT.  I recommended considering an MRI of the liver now and close follow-up of the chest and abdomen by CT imaging.  I recommend submitting tissue for Foundation 1 testing to include MSI testing.  This may provide informative data for future treatment. He understands the role for further surgery is limited at present, but it is possible he would be a candidate for hepatic/lung directed therapy in the future if he is found to have limited metastatic disease.  He plans to continue treatment with Dr. GiMaryjo Rochester We recommended he consider placement of a Port-A-Cath.  I am available to see him in the future as needed.  BrJulieanne MansonMD

## 2017-01-17 NOTE — Progress Notes (Signed)
  Oncology Nurse Navigator Documentation  Navigator Location: [facility] (01/17/17 1652)   )Navigator Encounter Type: Initial MedOnc (01/17/17 1652)   Abnormal Finding Date: 08/09/16 (01/17/17 1652) Confirmed Diagnosis Date: 08/10/16 (01/17/17 1652) Surgery Date: 12/24/16 (01/17/17 1652)             Patient Visit Type: Other(second opinion) (01/17/17 1652)   Barriers/Navigation Needs: Education(Port-A-Cath education) (01/17/17 1652)   Interventions: Psycho-social support (01/17/17 1652)  Met with patient along side Ned Card NP and Dr. Benay Spice. Patient educated on Port-A-Cath. No  Navigation needs identified. I will walk CT disks from [facility] to Radiology and will return original copys to patient.          Acuity: Level 1 (01/17/17 1652) Acuity Level 1: Initial guidance, education and coordination as needed;Minimal follow up required (01/17/17 1652) Acuity Level 2: Educational needs (01/17/17 1652)     Time Spent with Patient: 30 (01/17/17 1652)

## 2017-01-18 ENCOUNTER — Other Ambulatory Visit: Payer: Self-pay | Admitting: Oncology

## 2017-01-18 ENCOUNTER — Ambulatory Visit
Admission: RE | Admit: 2017-01-18 | Discharge: 2017-01-18 | Disposition: A | Discharge status: Home / Self Care | Payer: Self-pay | Source: Ambulatory Visit | Attending: Oncology | Admitting: Oncology

## 2017-01-18 ENCOUNTER — Ambulatory Visit
Admission: RE | Admit: 2017-01-18 | Discharge: 2017-01-18 | Disposition: A | Discharge status: Home / Self Care | Payer: 59 | Source: Ambulatory Visit | Attending: Oncology | Admitting: Oncology

## 2017-01-18 DIAGNOSIS — C801 Malignant (primary) neoplasm, unspecified: Secondary | ICD-10-CM

## 2017-07-10 ENCOUNTER — Encounter: Payer: Self-pay | Admitting: Physician Assistant

## 2017-07-10 DIAGNOSIS — C20 Malignant neoplasm of rectum: Secondary | ICD-10-CM | POA: Insufficient documentation

## 2017-07-20 ENCOUNTER — Other Ambulatory Visit: Payer: Self-pay

## 2017-07-20 ENCOUNTER — Emergency Department (HOSPITAL_BASED_OUTPATIENT_CLINIC_OR_DEPARTMENT_OTHER)
Admission: EM | Admit: 2017-07-20 | Discharge: 2017-07-20 | Disposition: A | Discharge status: Home / Self Care | Payer: 59 | Attending: Emergency Medicine | Admitting: Emergency Medicine

## 2017-07-20 ENCOUNTER — Encounter (HOSPITAL_BASED_OUTPATIENT_CLINIC_OR_DEPARTMENT_OTHER): Payer: Self-pay | Admitting: Emergency Medicine

## 2017-07-20 ENCOUNTER — Emergency Department (HOSPITAL_BASED_OUTPATIENT_CLINIC_OR_DEPARTMENT_OTHER): Payer: 59

## 2017-07-20 DIAGNOSIS — E860 Dehydration: Secondary | ICD-10-CM | POA: Insufficient documentation

## 2017-07-20 DIAGNOSIS — Z79899 Other long term (current) drug therapy: Secondary | ICD-10-CM | POA: Diagnosis not present

## 2017-07-20 DIAGNOSIS — C20 Malignant neoplasm of rectum: Secondary | ICD-10-CM | POA: Insufficient documentation

## 2017-07-20 DIAGNOSIS — R5383 Other fatigue: Secondary | ICD-10-CM

## 2017-07-20 HISTORY — DX: Malignant (primary) neoplasm, unspecified: C80.1

## 2017-07-20 LAB — HEPATIC FUNCTION PANEL
ALBUMIN: 3.5 g/dL (ref 3.5–5.0)
ALK PHOS: 94 U/L (ref 38–126)
ALT: 28 U/L (ref 17–63)
AST: 45 U/L — ABNORMAL HIGH (ref 15–41)
BILIRUBIN DIRECT: 0.1 mg/dL (ref 0.1–0.5)
Indirect Bilirubin: 0.9 mg/dL (ref 0.3–0.9)
Total Bilirubin: 1 mg/dL (ref 0.3–1.2)
Total Protein: 7.5 g/dL (ref 6.5–8.1)

## 2017-07-20 LAB — BASIC METABOLIC PANEL
ANION GAP: 7 (ref 5–15)
BUN: 10 mg/dL (ref 6–20)
CHLORIDE: 104 mmol/L (ref 101–111)
CO2: 26 mmol/L (ref 22–32)
Calcium: 8.8 mg/dL — ABNORMAL LOW (ref 8.9–10.3)
Creatinine, Ser: 0.81 mg/dL (ref 0.61–1.24)
GFR calc Af Amer: >60 mL/min (ref >60)
GLUCOSE: 90 mg/dL (ref 65–99)
POTASSIUM: 3.8 mmol/L (ref 3.5–5.1)
Sodium: 137 mmol/L (ref 135–145)

## 2017-07-20 LAB — URINALYSIS, ROUTINE W REFLEX MICROSCOPIC
BILIRUBIN URINE: NEGATIVE
GLUCOSE, UA: NEGATIVE mg/dL
Hgb urine dipstick: NEGATIVE
KETONES UR: NEGATIVE mg/dL
Leukocytes, UA: NEGATIVE
NITRITE: NEGATIVE
Protein, ur: NEGATIVE mg/dL
SPECIFIC GRAVITY, URINE: 1.015 (ref 1.005–1.030)
pH: 5.5 (ref 5.0–8.0)

## 2017-07-20 LAB — TROPONIN I: Troponin I: <0.03 ng/mL (ref <0.03)

## 2017-07-20 LAB — CBC
HEMATOCRIT: 37.5 % — AB (ref 39.0–52.0)
HEMOGLOBIN: 13.5 g/dL (ref 13.0–17.0)
MCH: 36.5 pg — AB (ref 26.0–34.0)
MCHC: 36 g/dL (ref 30.0–36.0)
MCV: 101.4 fL — ABNORMAL HIGH (ref 78.0–100.0)
Platelets: 143 10*3/uL — ABNORMAL LOW (ref 150–400)
RBC: 3.7 MIL/uL — ABNORMAL LOW (ref 4.22–5.81)
RDW: 13.7 % (ref 11.5–15.5)
WBC: 4.6 10*3/uL (ref 4.0–10.5)

## 2017-07-20 LAB — CBG MONITORING, ED: Glucose-Capillary: 89 mg/dL (ref 65–99)

## 2017-07-20 LAB — D-DIMER, QUANTITATIVE: D-Dimer, Quant: 1.78 ug/mL-FEU — ABNORMAL HIGH (ref 0.00–0.50)

## 2017-07-20 LAB — LIPASE, BLOOD: LIPASE: 24 U/L (ref 11–51)

## 2017-07-20 MED ORDER — HYDROMORPHONE HCL 1 MG/ML IJ SOLN
0.5000 mg | Freq: Once | INTRAMUSCULAR | Status: AC
  Administered 2017-07-20: 0.5 mg via INTRAVENOUS
  Filled 2017-07-20: qty 1

## 2017-07-20 MED ORDER — HEPARIN SOD (PORK) LOCK FLUSH 100 UNIT/ML IV SOLN
500.0000 [IU] | Freq: Once | INTRAVENOUS | Status: AC
  Administered 2017-07-20: 500 [IU]
  Filled 2017-07-20: qty 5

## 2017-07-20 MED ORDER — HYDROCODONE-ACETAMINOPHEN 5-325 MG PO TABS: 1.0000 | ORAL_TABLET | Freq: Four times a day (QID) | ORAL | 0 refills | Status: AC | PRN

## 2017-07-20 MED ORDER — HYDROCODONE-ACETAMINOPHEN 5-325 MG PO TABS: 1.0000 | ORAL_TABLET | Freq: Four times a day (QID) | ORAL | 0 refills | Status: DC | PRN

## 2017-07-20 MED ORDER — SODIUM CHLORIDE 0.9 % IV BOLUS
2000.0000 mL | Freq: Once | INTRAVENOUS | Status: AC
  Administered 2017-07-20: 2000 mL via INTRAVENOUS

## 2017-07-20 MED ORDER — ONDANSETRON HCL 4 MG/2ML IJ SOLN
4.0000 mg | Freq: Once | INTRAMUSCULAR | Status: AC
  Administered 2017-07-20: 4 mg via INTRAVENOUS
  Filled 2017-07-20: qty 2

## 2017-07-20 MED ORDER — IOPAMIDOL (ISOVUE-370) INJECTION 76%
100.0000 mL | Freq: Once | INTRAVENOUS | Status: AC | PRN
  Administered 2017-07-20: 63 mL via INTRAVENOUS

## 2017-07-20 MED ORDER — SODIUM CHLORIDE 0.9 % IV SOLN
INTRAVENOUS | Status: DC
  Administered 2017-07-20: 18:00:00 via INTRAVENOUS

## 2017-07-20 NOTE — ED Notes (Signed)
ED Provider at bedside. 

## 2017-07-20 NOTE — ED Triage Notes (Signed)
Patient states that he has cancer and he received bad news recently. The patients wife states that he has felt drained and acting like a "panic attack" since. She reports that he may be dehydrated. PAitent states that he is extremely exhausted and "super tired"

## 2017-07-20 NOTE — ED Notes (Signed)
Pt in X-Ray ?

## 2017-07-20 NOTE — ED Provider Notes (Signed)
[place] EMERGENCY DEPARTMENT Provider Note   CSN: [id number] Arrival date & time: 07/20/17  1610     History   Chief Complaint Chief Complaint  Patient presents with  . Fatigue    HPI Dwayne Fowler is a 32 y.o. male.  Patient with a history of rectal cancer.  With localized recurrence.  Patient also has Port-A-Cath in the upright her chest.  Patient seen at the [facility] ED earlier today because he felt as if he could not pee.  But urine was fine.  Patient since receiving the news of the recurrence has been very stressed.  Not able to sleep not able to eat or drink.  Patient is feeling dehydrated.  Patient is also been short of breath.  Patient is also been experiencing a lot of rectal pain.  Patient has been started on Ambien but has not gotten the prescription filled yet.  Patient has had left-sided colon resection.  Patient has a temporary ileostomy.     Past Medical History:  Diagnosis Date  . Cancer [facility])    colon rectal    Patient Active Problem List   Diagnosis Date Noted  . Rectal cancer ([place]) 07/10/2017  . Blood in stool 05/10/2016  . Inattention 05/10/2016  . Anxiety and depression 05/10/2016  . Obesity (BMI 30-39.9) 05/09/2016  . Hyperlipidemia 03/26/2014  . Abnormal weight gain 10/20/2013  . 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 RECTAL SURGERY          Home Medications    Prior to Admission medications   Medication Sig Start Date End Date Taking? Authorizing Provider  cholestyramine (QUESTRAN) 4 g packet Take by mouth. 01/16/17   [provider]  diphenoxylate-atropine (LOMOTIL) 2.5-0.025 MG tablet  12/26/16   [provider]  finasteride (PROSCAR) 5 MG tablet TAKE 1/4 TABLET BY MOUTH EVERY DAY 07/09/16   Breeback, Jade L, PA-C  HYDROcodone-acetaminophen (NORCO/VICODIN) 5-325 MG tablet Take 1-2 tablets by mouth every 6 (six) hours as needed for moderate  pain. 07/20/17   Fredia Sorrow, MD  ondansetron [facility]) 8 MG tablet  10/31/16   [provider]  prochlorperazine (COMPAZINE) 5 MG tablet Take 5 mg by mouth every 6 (six) hours as needed for nausea or vomiting.    [provider]  Skin Protectants, Misc. (EUCERIN) cream Apply topically twice daily and as needed for Dry Skin, hands, and feet. 01/16/17   [provider]  traMADol Veatrice Bourbon) 50 MG tablet  12/26/16   [provider]    Family History History reviewed. No pertinent family history.  Social History Social History   Tobacco Use  . Smoking status: Never Smoker  . Smokeless tobacco: Never Used  Substance Use Topics  . Alcohol use: Yes  . Drug use: Never     Allergies   Codeine   Review of Systems Review of Systems  Constitutional: Positive for fatigue.  HENT: Negative for congestion.   Eyes: Negative for redness.  Respiratory: Positive for shortness of breath.   Cardiovascular: Negative for chest pain.  Gastrointestinal: Positive for rectal pain. Negative for diarrhea, nausea and vomiting.  Genitourinary: Positive for difficulty urinating.  Musculoskeletal: Negative for back pain.  Skin: Negative for rash.  Allergic/Immunologic: Positive for immunocompromised state.  Neurological: Negative for headaches.  Hematological: Does not bruise/bleed easily.  Psychiatric/Behavioral: Positive for sleep disturbance. Negative for confusion. The patient is nervous/anxious.      Physical Exam Updated Vital Signs  BP 115/65 (BP Location: Right Arm)   Pulse 82   Temp 98.3 F (36.8 C) (Oral)   Resp 20   Ht 1.829 m (6')   Wt 132.9 kg (293 lb)   SpO2 97%   BMI 39.74 kg/m   Physical Exam  Constitutional: He is oriented to person, place, and time. He appears well-developed and well-nourished. No distress.  HENT:  Head: Normocephalic and atraumatic.  Right Ear: External ear normal.  Mucous membranes dry.  Eyes: Pupils are equal,  round, and reactive to light. Conjunctivae and EOM are normal.  Neck: Neck supple.  Cardiovascular: Regular rhythm.  Tachycardic  Pulmonary/Chest: Effort normal and breath sounds normal. No respiratory distress.  Port-A-Cath right upper chest area.  Abdominal: Soft. Bowel sounds are normal. There is no tenderness.  Ileostomy bag.  Ileostomy healthy.  Musculoskeletal: Normal range of motion. He exhibits no edema.  Neurological: He is alert and oriented to person, place, and time. No cranial nerve deficit or sensory deficit. He exhibits normal muscle tone. Coordination normal.  Skin: Skin is warm.  Nursing note and vitals reviewed.    ED Treatments / Results  Labs (all labs ordered are listed, but only abnormal results are displayed) Labs Reviewed  BASIC METABOLIC PANEL - Abnormal; Notable for the following components:      Result Value   Calcium 8.8 (*)    All other components within normal limits  CBC - Abnormal; Notable for the following components:   RBC 3.70 (*)    HCT 37.5 (*)    MCV 101.4 (*)    MCH 36.5 (*)    Platelets 143 (*)    All other components within normal limits  HEPATIC FUNCTION PANEL - Abnormal; Notable for the following components:   AST 45 (*)    All other components within normal limits  D-DIMER, QUANTITATIVE (NOT AT [facility]) - Abnormal; Notable for the following components:   D-Dimer, Quant 1.78 (*)    All other components within normal limits  URINALYSIS, ROUTINE W REFLEX MICROSCOPIC  LIPASE, BLOOD  TROPONIN I  CBG MONITORING, ED    EKG EKG Interpretation  Date/Time:  Saturday July 20 2017 16:38:54 EDT Ventricular Rate:  84 PR Interval:    QRS Duration: 105 QT Interval:  383 QTC Calculation: 453 R Axis:   88 Text Interpretation:  Sinus rhythm No previous ECGs available Confirmed by Fredia Sorrow [phone]) on 07/20/2017 4:48:49 PM   Radiology Dg Chest 2 View  Result Date: 07/20/2017 CLINICAL DATA:  Dizziness, exhaustion and confusion. EXAM:  CHEST - 2 VIEW COMPARISON:  01/05/2017 FINDINGS: Right chest wall port a catheter tip is at the cavoatrial junction. The heart size and mediastinal contours are within normal limits. Both lungs are clear. The visualized skeletal structures are unremarkable. IMPRESSION: No active cardiopulmonary disease. Electronically Signed   By: Kerby Moors M.D.   On: 07/20/2017 17:54   Ct Angio Chest Pe W/cm &/or Wo Cm  Result Date: 07/20/2017 CLINICAL DATA:  Suspected PE.  Positive D-dimer. EXAM: CT ANGIOGRAPHY CHEST WITH CONTRAST TECHNIQUE: Multidetector CT imaging of the chest was performed using the standard protocol during bolus administration of intravenous contrast. Multiplanar CT image reconstructions and MIPs were obtained to evaluate the vascular anatomy. CONTRAST:  62mL ISOVUE-370 IOPAMIDOL (ISOVUE-370) INJECTION 76% COMPARISON:  None FINDINGS: Cardiovascular: No filling defects within the pulmonary arteries to suggest acute pulmonary embolism. No acute findings of the aorta or great vessels. No pericardial fluid. Port in the anterior chest wall with tip  in distal SVC. Mediastinum/Nodes: No axillary supraclavicular adenopathy. No mediastinal hilar adenopathy. No pericardial effusion. Lungs/Pleura: No suspicious pulmonary nodules. Small calcified granuloma RIGHT middle lobe. Airways normal. Upper Abdomen: Limited view of the liver, kidneys, pancreas are unremarkable. Normal adrenal glands. Musculoskeletal: No aggressive osseous lesion. Review of the MIP images confirms the above findings. IMPRESSION: 1. No acute pulmonary embolism. 2. No acute pulmonary parenchymal abnormality. Electronically Signed   By: Suzy Bouchard M.D.   On: 07/20/2017 19:05    Procedures Procedures (including critical care time)  Medications Ordered in ED Medications  0.9 %  sodium chloride infusion ( Intravenous Stopped 07/20/17 2057)  heparin lock flush 100 unit/mL (has no administration in time range)  sodium chloride 0.9 %  bolus 2,000 mL (0 mLs Intravenous Stopped 07/20/17 1822)  ondansetron (ZOFRAN) injection 4 mg (4 mg Intravenous Given 07/20/17 1800)  HYDROmorphone (DILAUDID) injection 0.5 mg (0.5 mg Intravenous Given 07/20/17 1800)  iopamidol (ISOVUE-370) 76 % injection 100 mL (63 mLs Intravenous Contrast Given 07/20/17 1836)  HYDROmorphone (DILAUDID) injection 0.5 mg (0.5 mg Intravenous Given 07/20/17 2051)  ondansetron (ZOFRAN) injection 4 mg (4 mg Intravenous Given 07/20/17 2051)     Initial Impression / Assessment and Plan / ED Course  I have reviewed the triage vital signs and the nursing notes.  Pertinent labs & imaging results that were available during my care of the patient were reviewed by me and considered in my medical decision making (see chart for details).    Work-up here patient clinically dehydrated.  Patient received 2 L of fluid felt much better.  But still was not urinating briskly.  Patient d-dimer was elevated which led to a CT of chest but negative for any pulmonary embolus.  Right her chest x-ray was negative.  Regular labs without any significant abnormalities.  Patient did receive Zofran and hydromorphone for pain.  Also improved him significantly felt much better.  Also felt better after the IV hydration.  Patient's been having rectal pain.  Appropriate to discharge him home with hydrocodone.  He has Zofran at home.  And can follow back up with his Doxy was feeling much better at the time of discharge.  No significant abnormalities found.  Feel that patient was feeling bad a lot to do with rectal pain as well as some of the stress of the recent news of the recurrence locally.   Final Clinical Impressions(s) / ED Diagnoses   Final diagnoses:  Fatigue, unspecified type  Dehydration  Rectal cancer [facility])    ED Discharge Orders        Ordered    HYDROcodone-acetaminophen (NORCO/VICODIN) 5-325 MG tablet  Every 6 hours PRN,   Status:  Discontinued     07/20/17 2057     HYDROcodone-acetaminophen (NORCO/VICODIN) 5-325 MG tablet  Every 6 hours PRN     07/20/17 8250       Fredia Sorrow, MD 07/21/17 0153

## 2017-07-20 NOTE — Discharge Instructions (Signed)
Take the pain medicine as needed.  Also start the Ambien you were prescribed to help you sleep at night.  Make sure you take some antinausea medicine with the pain medicine that you already have at home.  Follow-up with your doctors as scheduled and as needed.  Return for any new or worse symptoms.  Work on increasing your fluid intake.

## 2018-01-04 MED ORDER — SODIUM CHLORIDE 0.9 % IV SOLN
10.00 | INTRAVENOUS | Status: DC
Start: ? — End: 2018-01-04

## 2018-01-04 MED ORDER — GENERIC EXTERNAL MEDICATION
5.00 | Status: DC
Start: ? — End: 2018-01-04

## 2018-05-07 DEATH — deceased

## 2019-10-24 IMAGING — DX DG CHEST 2V
2 series · 2 of 2 positions shown · non-contrast
Comparison: 01/05/2017

CLINICAL DATA: Dizziness, exhaustion and confusion.

EXAM:
CHEST - 2 VIEW

[chest pa]
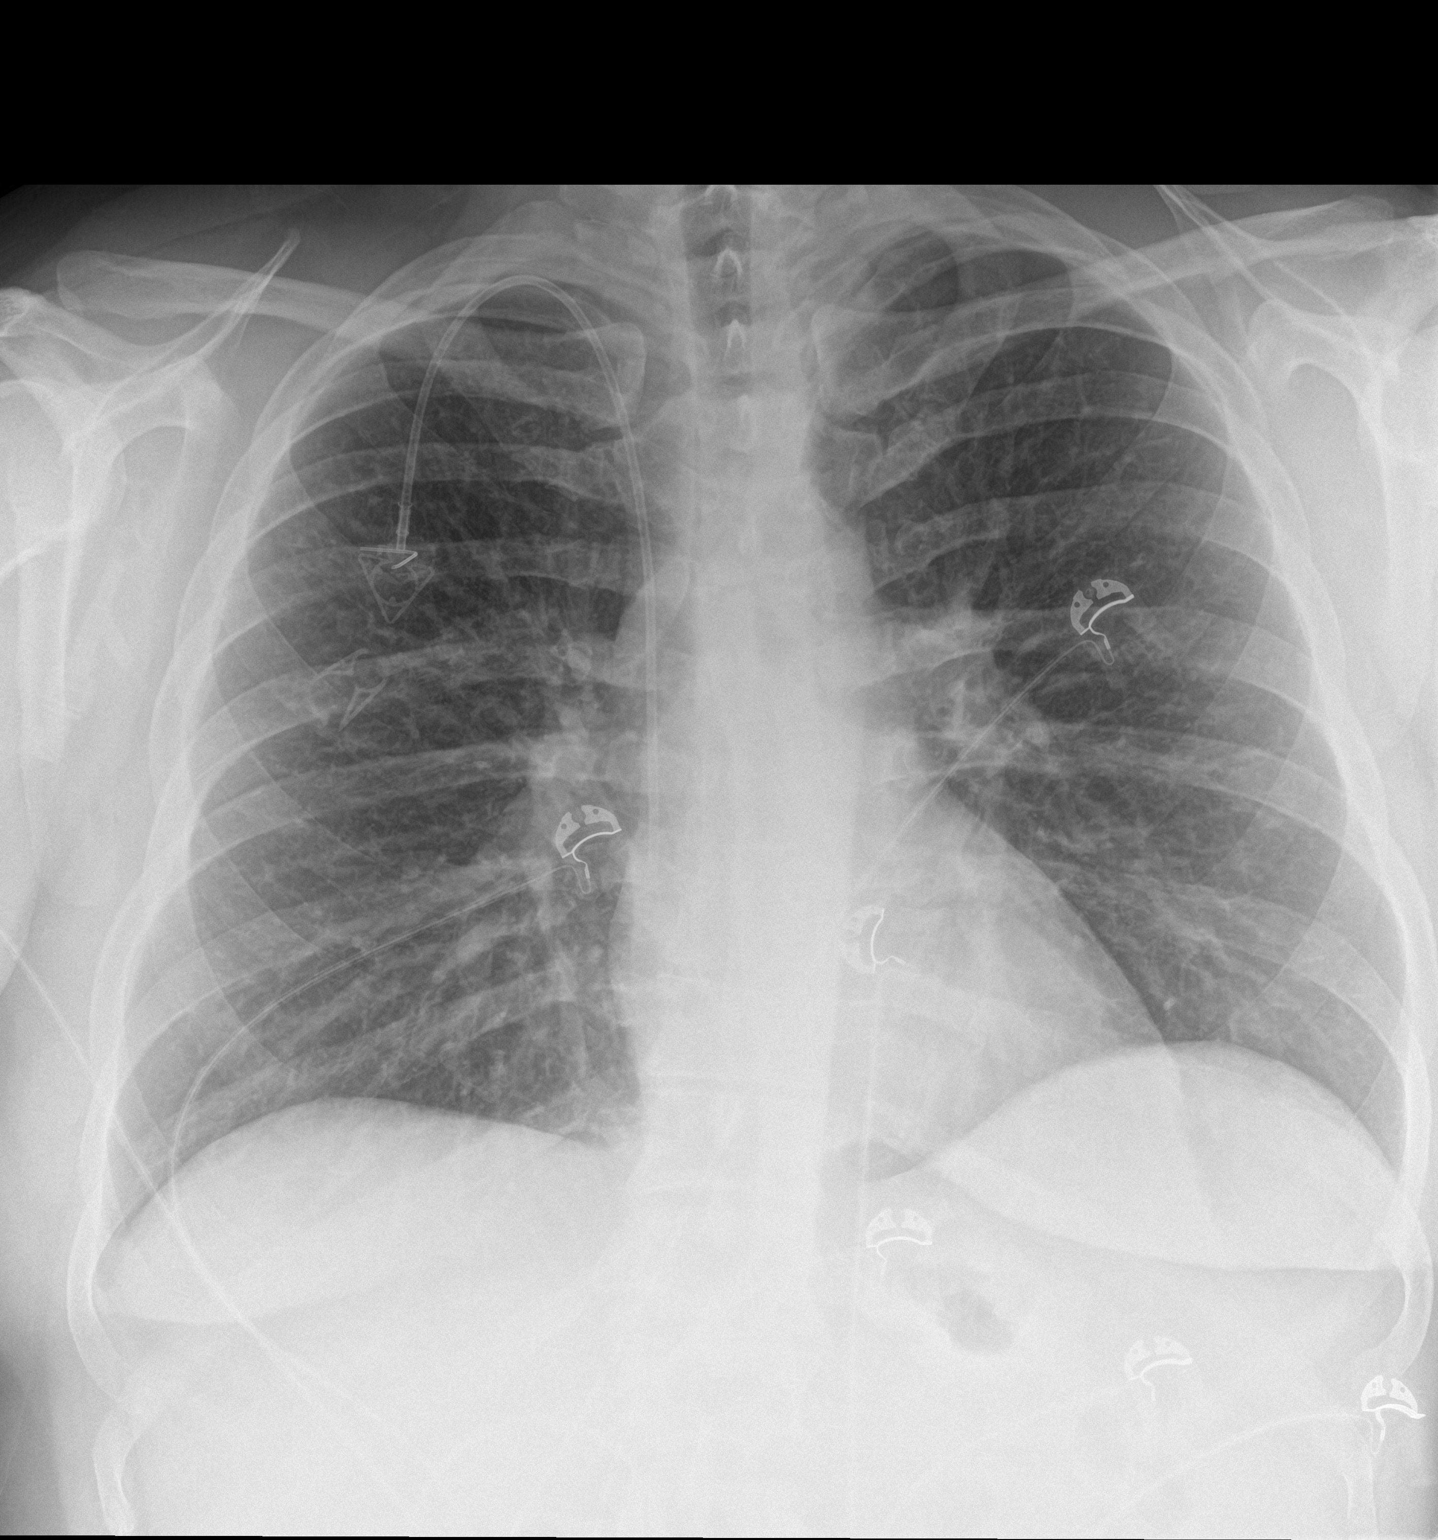

[chest lat]
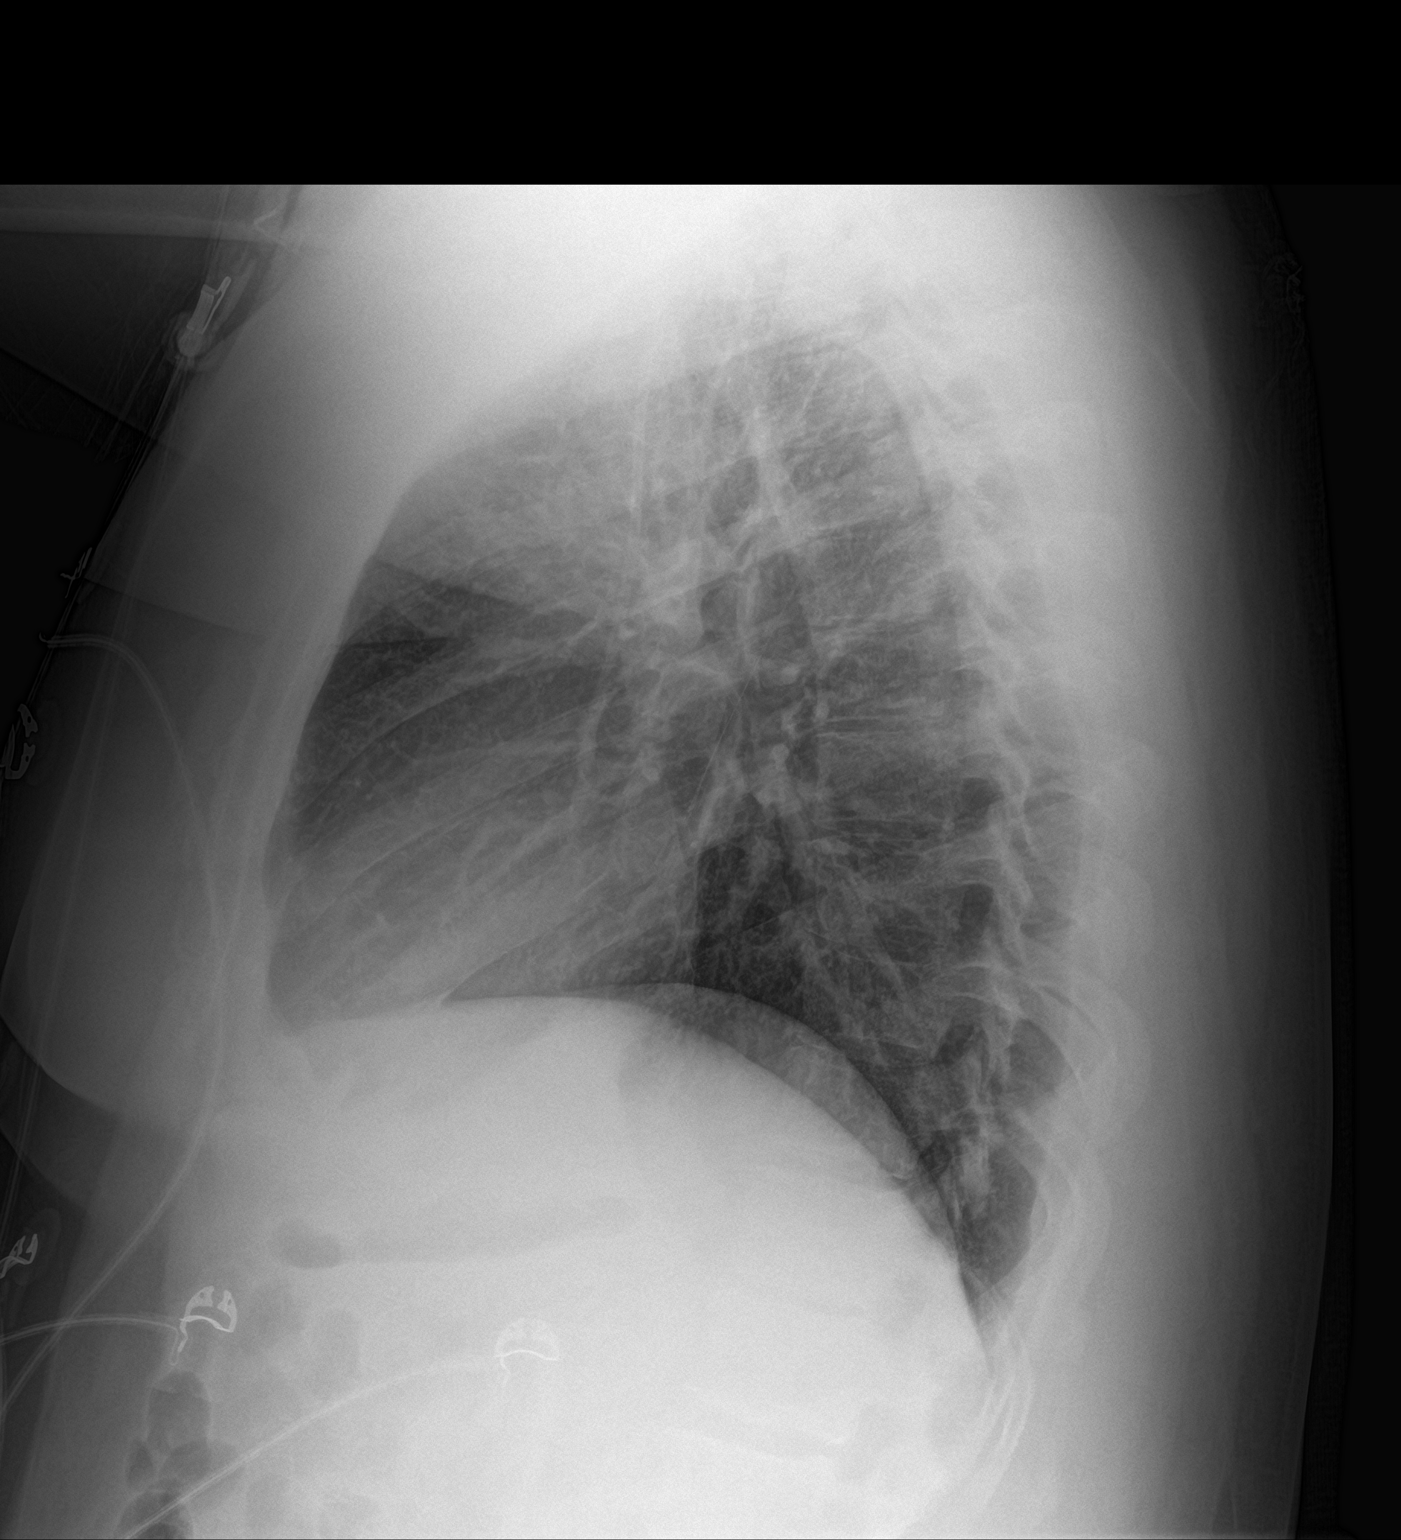

[2 of 2 positions shown; findings below may reference images not displayed]

FINDINGS: Right chest wall port a catheter tip is at the cavoatrial junction.
The heart size and mediastinal contours are within normal limits.
Both lungs are clear. The visualized skeletal structures are
unremarkable.
IMPRESSION: No active cardiopulmonary disease.

## 2019-10-24 IMAGING — CT CT ANGIO CHEST
2 of 8 series · 19 of 36 positions shown · IV contrast (iopamidol)
Comparison: None

CLINICAL DATA: Suspected PE.  Positive D-dimer.

EXAM:
CT ANGIOGRAPHY CHEST WITH CONTRAST
TECHNIQUE: Multidetector CT imaging of the chest was performed using the
standard protocol during bolus administration of intravenous
contrast. Multiplanar CT image reconstructions and MIPs were
obtained to evaluate the vascular anatomy.
CONTRAST:  63mL 5KJXWG-UNX IOPAMIDOL (5KJXWG-UNX) INJECTION 76%

[Series 6: pe thins · axial · 0.86mm/px · z∈[-295,-26]mm · 18 of 301 slices shown]
[im 16/301  lung]
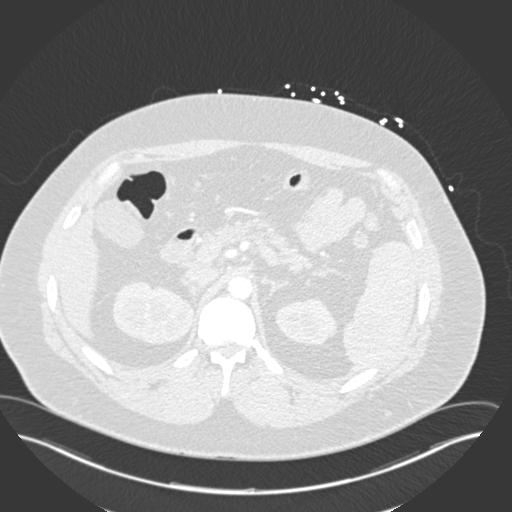
[im 32/301  mediastinal]
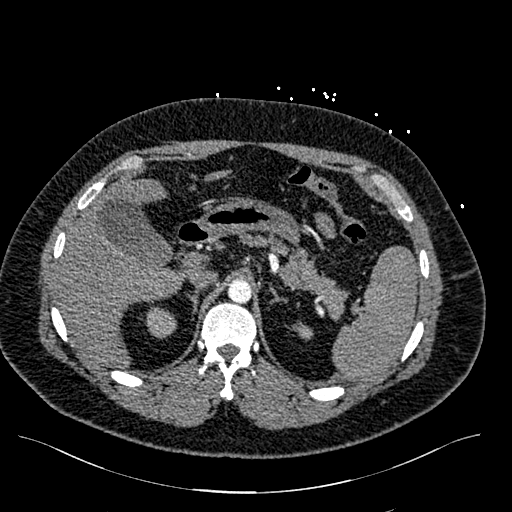
[im 48/301  lung]
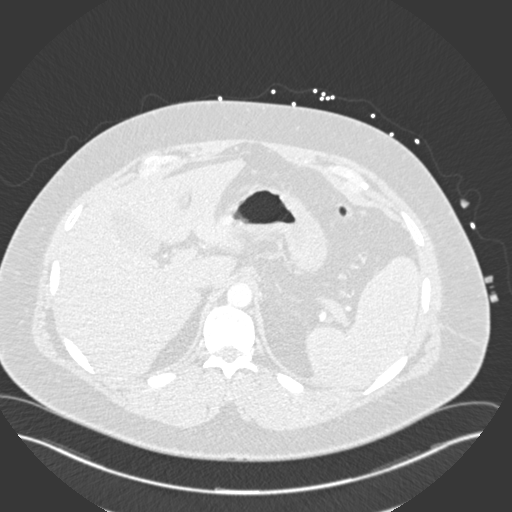
[im 64/301  mediastinal]
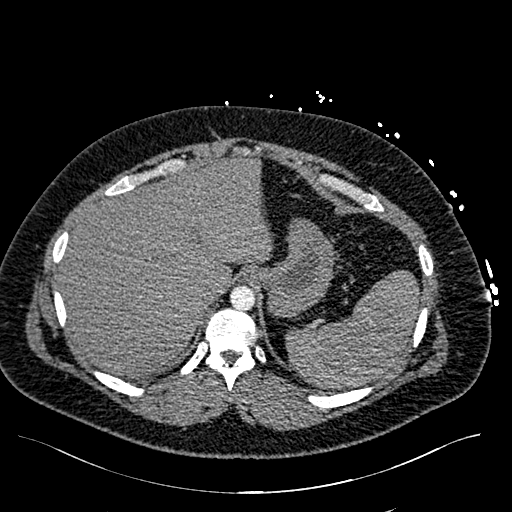
[im 79/301  lung]
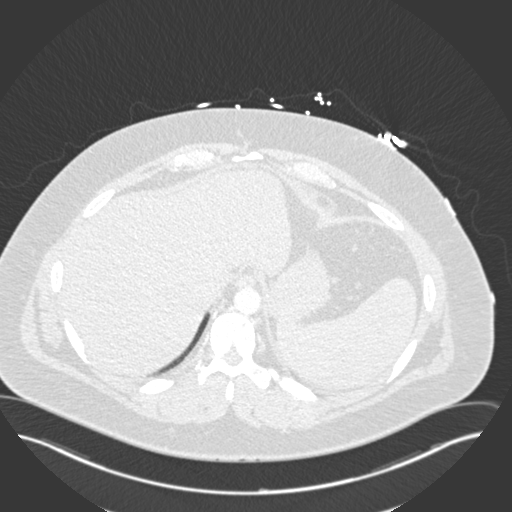
[im 95/301  mediastinal]
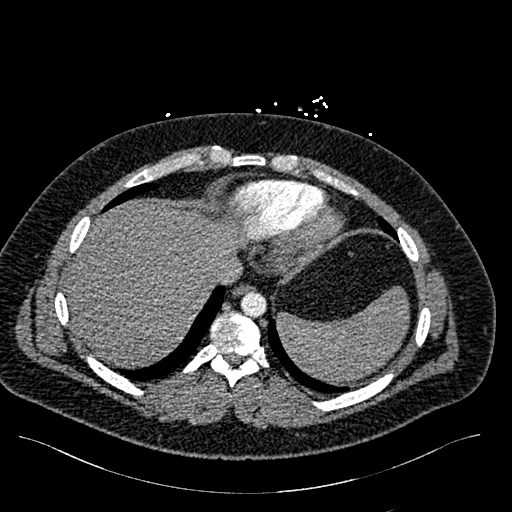
[im 111/301  lung]
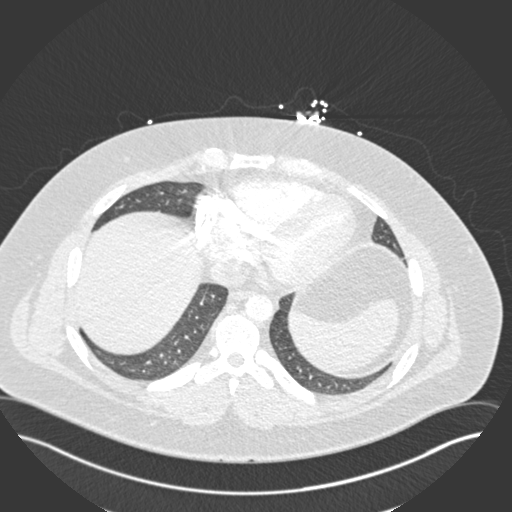
[im 127/301  mediastinal]
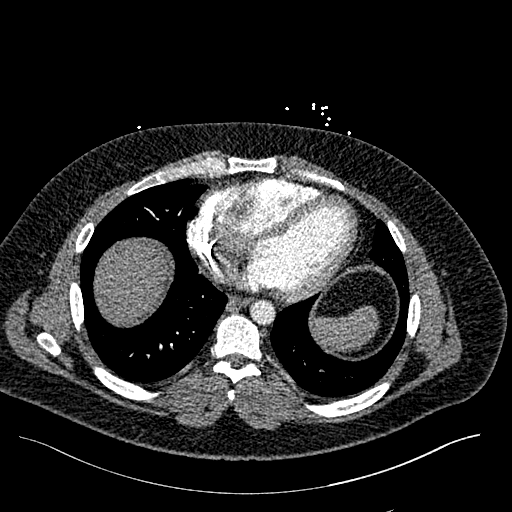
[im 143/301  lung]
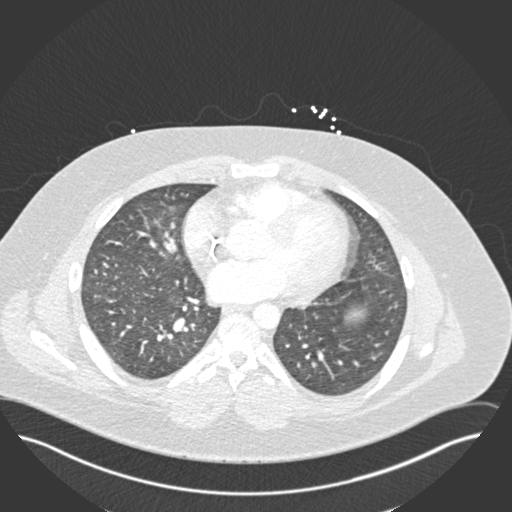
[im 158/301  mediastinal]
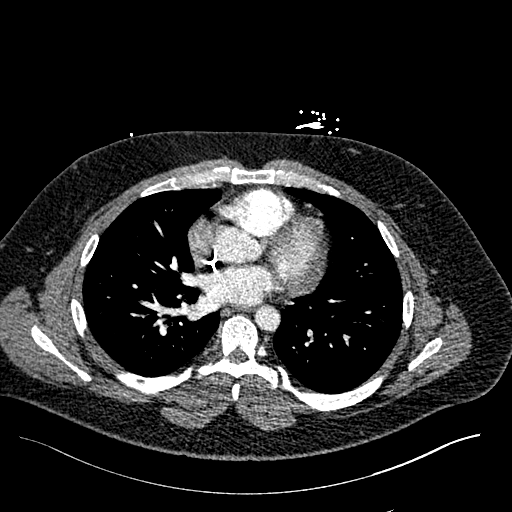
[im 174/301  lung]
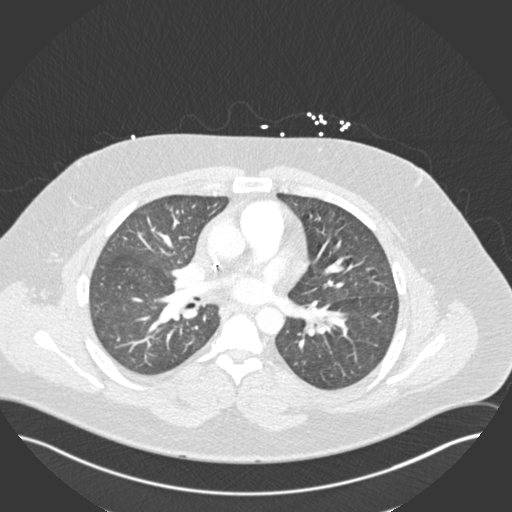
[im 190/301  mediastinal]
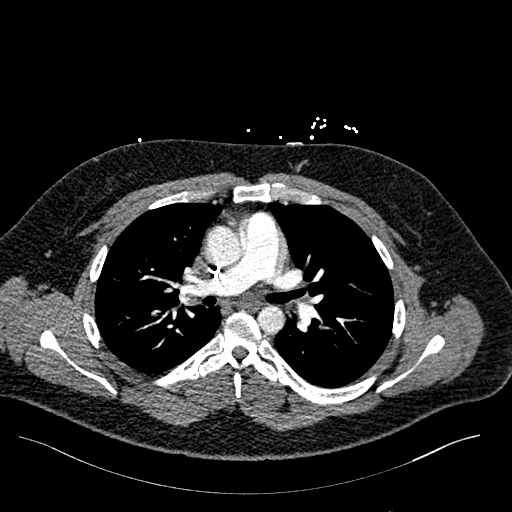
[im 206/301  lung]
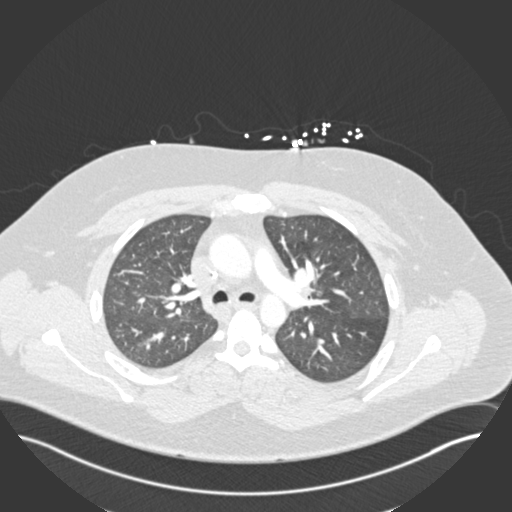
[im 222/301  mediastinal]
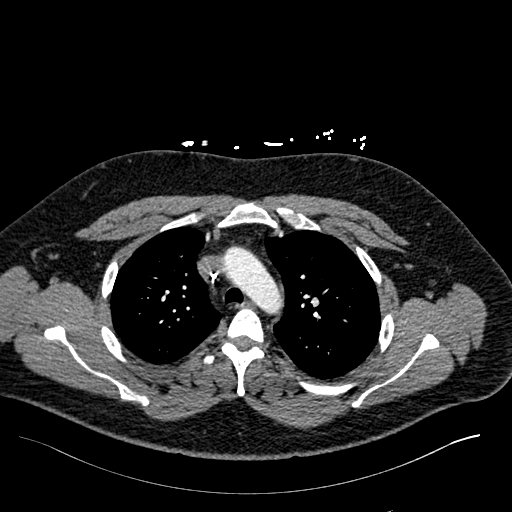
[im 237/301  lung]
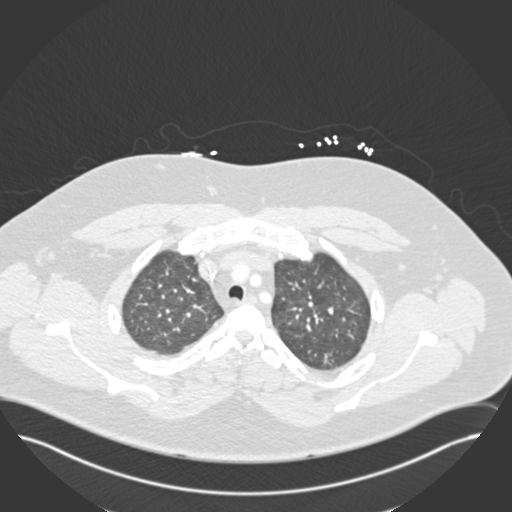
[im 253/301  mediastinal]
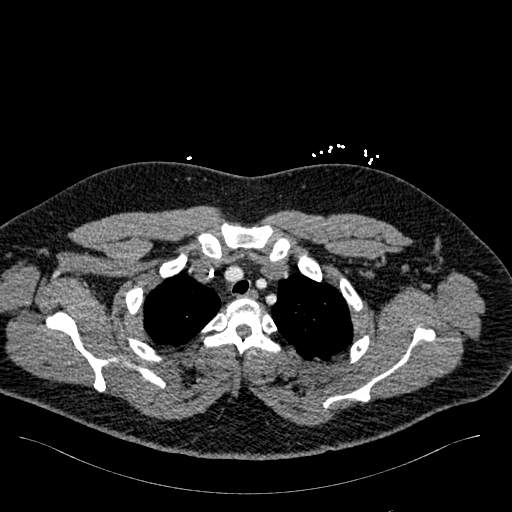
[im 269/301  lung]
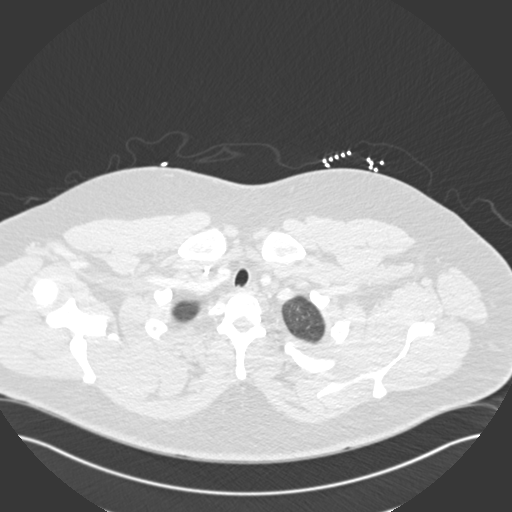
[im 285/301  mediastinal]
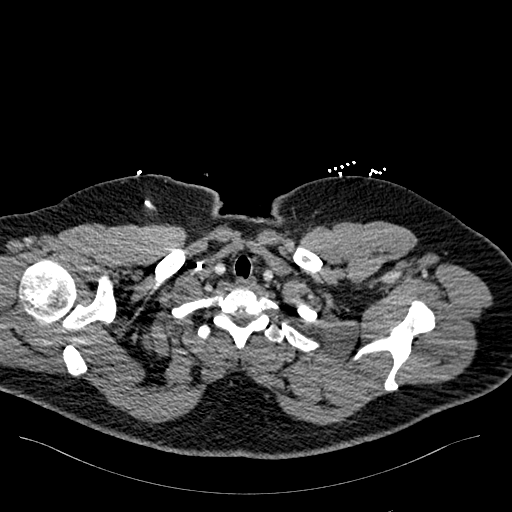

[Series 7: pe coronal mpr · coronal · 0.63mm/px · 1 of 148 slices shown]
[im 74/148  mediastinal]
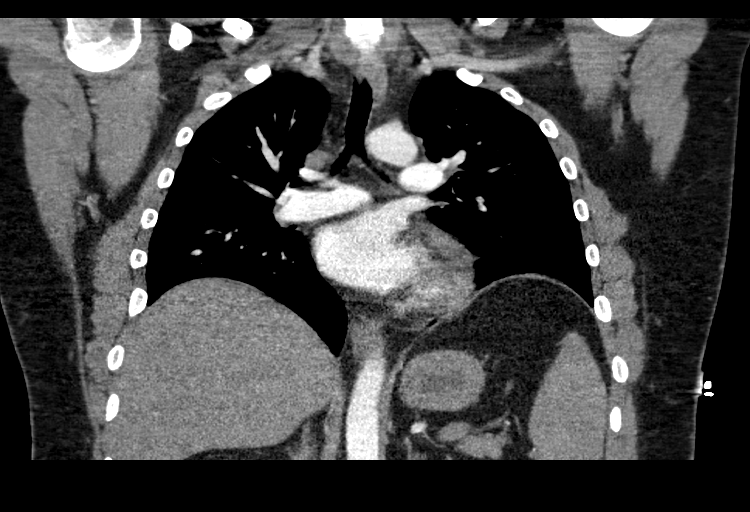

[19 of 36 positions shown; findings below may reference images not displayed]

FINDINGS: Cardiovascular: No filling defects within the pulmonary arteries to
suggest acute pulmonary embolism. No acute findings of the aorta or
great vessels. No pericardial fluid.

Port in the anterior chest wall with tip in distal SVC.

Mediastinum/Nodes: No axillary supraclavicular adenopathy. No
mediastinal hilar adenopathy. No pericardial effusion.

Lungs/Pleura: No suspicious pulmonary nodules. Small calcified
granuloma RIGHT middle lobe. Airways normal.

Upper Abdomen: Limited view of the liver, kidneys, pancreas are
unremarkable. Normal adrenal glands.

Musculoskeletal: No aggressive osseous lesion.

Review of the MIP images confirms the above findings.
IMPRESSION: 1. No acute pulmonary embolism.
2. No acute pulmonary parenchymal abnormality.
